# Patient Record
Sex: Female | Born: 1960 | Race: White | Hispanic: No | Smoking: Former smoker
Health system: Southern US, Community
[De-identification: ages and names within clinical notes are randomized; demographics above are authoritative.]

## PROBLEM LIST (undated history)

## (undated) ENCOUNTER — Emergency Department: Disposition: A | Discharge status: Home / Self Care | Payer: Managed Care, Other (non HMO)

## (undated) DIAGNOSIS — R519 Headache, unspecified: Secondary | ICD-10-CM

## (undated) DIAGNOSIS — K602 Anal fissure, unspecified: Secondary | ICD-10-CM

## (undated) DIAGNOSIS — E7800 Pure hypercholesterolemia, unspecified: Secondary | ICD-10-CM

## (undated) DIAGNOSIS — N840 Polyp of corpus uteri: Secondary | ICD-10-CM

## (undated) DIAGNOSIS — R51 Headache: Secondary | ICD-10-CM

## (undated) DIAGNOSIS — G43909 Migraine, unspecified, not intractable, without status migrainosus: Secondary | ICD-10-CM

## (undated) DIAGNOSIS — G479 Sleep disorder, unspecified: Secondary | ICD-10-CM

## (undated) HISTORY — DX: Polyp of corpus uteri: N84.0

## (undated) HISTORY — DX: Sleep disorder, unspecified: G47.9

## (undated) HISTORY — DX: Headache, unspecified: R51.9

## (undated) HISTORY — PX: WRIST SURGERY: SHX841

## (undated) HISTORY — DX: Headache: R51

## (undated) HISTORY — DX: Pure hypercholesterolemia, unspecified: E78.00

## (undated) HISTORY — DX: Anal fissure, unspecified: K60.2

---

## 1999-07-31 ENCOUNTER — Encounter: Payer: Self-pay | Admitting: Obstetrics and Gynecology

## 1999-07-31 ENCOUNTER — Ambulatory Visit (HOSPITAL_COMMUNITY): Admission: RE | Admit: 1999-07-31 | Discharge: 1999-07-31 | Payer: Self-pay | Admitting: Obstetrics and Gynecology

## 1999-08-05 ENCOUNTER — Other Ambulatory Visit: Admission: RE | Admit: 1999-08-05 | Discharge: 1999-08-05 | Payer: Self-pay | Admitting: Obstetrics and Gynecology

## 2000-07-23 ENCOUNTER — Encounter: Admission: RE | Admit: 2000-07-23 | Discharge: 2000-07-23 | Payer: Self-pay | Admitting: Obstetrics and Gynecology

## 2000-07-23 ENCOUNTER — Encounter: Payer: Self-pay | Admitting: Obstetrics and Gynecology

## 2000-11-27 ENCOUNTER — Other Ambulatory Visit: Admission: RE | Admit: 2000-11-27 | Discharge: 2000-11-27 | Payer: Self-pay | Admitting: Obstetrics and Gynecology

## 2001-07-20 ENCOUNTER — Ambulatory Visit (HOSPITAL_COMMUNITY): Admission: RE | Admit: 2001-07-20 | Discharge: 2001-07-20 | Payer: Self-pay | Admitting: Obstetrics and Gynecology

## 2001-07-20 ENCOUNTER — Encounter: Payer: Self-pay | Admitting: Obstetrics and Gynecology

## 2001-12-01 ENCOUNTER — Other Ambulatory Visit: Admission: RE | Admit: 2001-12-01 | Discharge: 2001-12-01 | Payer: Self-pay | Admitting: Obstetrics and Gynecology

## 2002-07-20 ENCOUNTER — Ambulatory Visit (HOSPITAL_COMMUNITY): Admission: RE | Admit: 2002-07-20 | Discharge: 2002-07-20 | Payer: Self-pay | Admitting: Obstetrics and Gynecology

## 2002-07-20 ENCOUNTER — Encounter: Payer: Self-pay | Admitting: Obstetrics and Gynecology

## 2003-08-01 ENCOUNTER — Encounter: Payer: Self-pay | Admitting: Obstetrics and Gynecology

## 2003-08-01 ENCOUNTER — Encounter: Admission: RE | Admit: 2003-08-01 | Discharge: 2003-08-01 | Payer: Self-pay | Admitting: Obstetrics and Gynecology

## 2003-08-04 ENCOUNTER — Other Ambulatory Visit: Admission: RE | Admit: 2003-08-04 | Discharge: 2003-08-04 | Payer: Self-pay | Admitting: Obstetrics and Gynecology

## 2004-07-24 ENCOUNTER — Encounter: Admission: RE | Admit: 2004-07-24 | Discharge: 2004-07-24 | Payer: Self-pay | Admitting: Obstetrics and Gynecology

## 2004-08-21 ENCOUNTER — Other Ambulatory Visit: Admission: RE | Admit: 2004-08-21 | Discharge: 2004-08-21 | Payer: Self-pay | Admitting: Obstetrics and Gynecology

## 2004-09-25 ENCOUNTER — Encounter: Admission: RE | Admit: 2004-09-25 | Discharge: 2004-09-25 | Payer: Self-pay | Admitting: Gastroenterology

## 2004-11-18 ENCOUNTER — Ambulatory Visit (HOSPITAL_COMMUNITY): Admission: RE | Admit: 2004-11-18 | Discharge: 2004-11-18 | Payer: Self-pay | Admitting: Gastroenterology

## 2004-12-15 DIAGNOSIS — K602 Anal fissure, unspecified: Secondary | ICD-10-CM

## 2004-12-15 HISTORY — DX: Anal fissure, unspecified: K60.2

## 2005-08-05 ENCOUNTER — Ambulatory Visit (HOSPITAL_COMMUNITY): Admission: RE | Admit: 2005-08-05 | Discharge: 2005-08-05 | Payer: Self-pay | Admitting: Obstetrics and Gynecology

## 2005-08-25 ENCOUNTER — Other Ambulatory Visit: Admission: RE | Admit: 2005-08-25 | Discharge: 2005-08-25 | Payer: Self-pay | Admitting: Obstetrics and Gynecology

## 2006-07-29 ENCOUNTER — Ambulatory Visit (HOSPITAL_COMMUNITY): Admission: RE | Admit: 2006-07-29 | Discharge: 2006-07-29 | Payer: Self-pay | Admitting: Obstetrics and Gynecology

## 2006-08-04 ENCOUNTER — Encounter: Admission: RE | Admit: 2006-08-04 | Discharge: 2006-08-04 | Payer: Self-pay | Admitting: Obstetrics and Gynecology

## 2006-09-03 ENCOUNTER — Other Ambulatory Visit: Admission: RE | Admit: 2006-09-03 | Discharge: 2006-09-03 | Payer: Self-pay | Admitting: Obstetrics and Gynecology

## 2007-08-02 ENCOUNTER — Encounter: Admission: RE | Admit: 2007-08-02 | Discharge: 2007-08-02 | Payer: Self-pay | Admitting: Obstetrics and Gynecology

## 2007-09-09 ENCOUNTER — Other Ambulatory Visit: Admission: RE | Admit: 2007-09-09 | Discharge: 2007-09-09 | Payer: Self-pay | Admitting: Obstetrics and Gynecology

## 2007-12-16 HISTORY — PX: DILATION AND CURETTAGE OF UTERUS: SHX78

## 2008-04-27 HISTORY — PX: DOPPLER ECHOCARDIOGRAPHY: SHX263

## 2008-08-02 ENCOUNTER — Encounter: Admission: RE | Admit: 2008-08-02 | Discharge: 2008-08-02 | Payer: Self-pay | Admitting: Obstetrics and Gynecology

## 2008-09-13 ENCOUNTER — Ambulatory Visit: Payer: Self-pay | Admitting: Obstetrics and Gynecology

## 2008-09-13 ENCOUNTER — Other Ambulatory Visit: Admission: RE | Admit: 2008-09-13 | Discharge: 2008-09-13 | Payer: Self-pay | Admitting: Obstetrics and Gynecology

## 2008-09-13 ENCOUNTER — Encounter: Payer: Self-pay | Admitting: Obstetrics and Gynecology

## 2008-09-28 ENCOUNTER — Ambulatory Visit: Payer: Self-pay | Admitting: Obstetrics and Gynecology

## 2008-10-13 ENCOUNTER — Ambulatory Visit: Payer: Self-pay | Admitting: Obstetrics and Gynecology

## 2008-11-14 ENCOUNTER — Ambulatory Visit: Payer: Self-pay | Admitting: Obstetrics and Gynecology

## 2008-11-21 ENCOUNTER — Ambulatory Visit (HOSPITAL_BASED_OUTPATIENT_CLINIC_OR_DEPARTMENT_OTHER): Admission: RE | Admit: 2008-11-21 | Discharge: 2008-11-21 | Payer: Self-pay | Admitting: Obstetrics and Gynecology

## 2008-11-21 ENCOUNTER — Encounter: Payer: Self-pay | Admitting: Obstetrics and Gynecology

## 2008-11-21 ENCOUNTER — Ambulatory Visit: Payer: Self-pay | Admitting: Obstetrics and Gynecology

## 2008-12-05 ENCOUNTER — Ambulatory Visit: Payer: Self-pay | Admitting: Obstetrics and Gynecology

## 2009-07-19 ENCOUNTER — Ambulatory Visit: Payer: Self-pay | Admitting: Obstetrics and Gynecology

## 2009-08-16 ENCOUNTER — Encounter: Admission: RE | Admit: 2009-08-16 | Discharge: 2009-08-16 | Payer: Self-pay | Admitting: Obstetrics and Gynecology

## 2009-09-21 ENCOUNTER — Ambulatory Visit: Payer: Self-pay | Admitting: Obstetrics and Gynecology

## 2009-09-21 ENCOUNTER — Other Ambulatory Visit: Admission: RE | Admit: 2009-09-21 | Discharge: 2009-09-21 | Payer: Self-pay | Admitting: Obstetrics and Gynecology

## 2009-09-21 ENCOUNTER — Encounter: Payer: Self-pay | Admitting: Obstetrics and Gynecology

## 2010-06-21 ENCOUNTER — Ambulatory Visit: Payer: Self-pay | Admitting: Obstetrics and Gynecology

## 2011-01-05 ENCOUNTER — Encounter: Payer: Self-pay | Admitting: Obstetrics and Gynecology

## 2011-01-13 ENCOUNTER — Other Ambulatory Visit: Payer: Self-pay | Admitting: Obstetrics and Gynecology

## 2011-01-13 DIAGNOSIS — Z0000 Encounter for general adult medical examination without abnormal findings: Secondary | ICD-10-CM

## 2011-01-15 ENCOUNTER — Other Ambulatory Visit: Payer: Self-pay | Admitting: Obstetrics and Gynecology

## 2011-01-15 ENCOUNTER — Ambulatory Visit
Admission: RE | Admit: 2011-01-15 | Discharge: 2011-01-15 | Disposition: A | Discharge status: Home / Self Care | Payer: Self-pay | Source: Ambulatory Visit | Attending: Obstetrics and Gynecology | Admitting: Obstetrics and Gynecology

## 2011-01-15 ENCOUNTER — Ambulatory Visit: Admission: RE | Admit: 2011-01-15 | Payer: Self-pay | Source: Ambulatory Visit

## 2011-01-15 DIAGNOSIS — Z0000 Encounter for general adult medical examination without abnormal findings: Secondary | ICD-10-CM

## 2011-03-07 ENCOUNTER — Inpatient Hospital Stay: Admission: RE | Admit: 2011-03-07 | Payer: Self-pay | Source: Ambulatory Visit

## 2011-04-29 NOTE — Op Note (Signed)
NAMELOLAMAE, VOISIN                  ACCOUNT NO.:  [id number]   MEDICAL RECORD NO.:  [id number]          PATIENT TYPE:  AMB   LOCATION:  [facility]                         FACILITY:  [facility]   PHYSICIAN:  Daniel L. Gottsegen, M.D.DATE OF BIRTH:  11/29/1961   DATE OF PROCEDURE:  11/21/2008  DATE OF DISCHARGE:                               OPERATIVE REPORT   PREOPERATIVE DIAGNOSIS:  Menorrhagia with an endometrial polyp.   POSTOPERATIVE DIAGNOSIS:  Menorrhagia with an endometrial polyp.   OPERATIONS:  Hysteroscopy; D and C.   SURGEON:  Dr. Eda Paschal   ANESTHESIA:  General.   INDICATIONS:  The patient is a 50 year old gravida 2, para 2, AB 0 who  had presented to the office with a prolonged period that was extremely  heavy.  This was unusual for her.  She underwent ultrasound followed by  saline infusion histogram and had a 2 cm polyp that was found.  She was  initiated on Megace to stop the bleeding and she enters the hospital now  for hysteroscopy D and C and excision of polyp.   FINDINGS:  External is normal.  BUS is normal.  Vaginal is normal.  Cervix is clean.  The uterus appears slightly enlarged and boggy,  although no myomas had been seen on ultrasound.  Although the appearance  of her uterus was consistent with adenomyosis which might explain the  boggy uterus.  Adnexa failed to reveal masses.  At the time of  hysteroscopy, the patient had an endometrial polyp coming off the  posterior fundal wall.  Once it was removed, top of the fundus, tubal  ostia, anterior posterior walls of the fundus, lower uterine segment,  endocervical canal were free of disease.   DESCRIPTION OF PROCEDURE:  After adequate general anesthesia, the  patient was placed in dorsal supine position, prepped and draped in  usual sterile manner.  A single-tooth tenaculum was placed in the  anterior lip of the cervix.  The patient had just slight uterine  descensus.  It was difficult to dilate her cervix, but it  finally was  dilated to #[facility] dilator.  A hysteroscopic examination was done  using the hysteroscopic resectoscope, 3% sorbitol expanded the  intrauterine cavity.  A camera for magnification and 90 degree wire loop  with appropriate settings.  The above findings were found.  The  endometrial polyp was excised with the wire loop, sharp curetting was  also done with removal of more tissue and endometrium.  The patient was  re-hysteroscoped and she had no further intrauterine pathology.  The  procedure was terminated.  Blood loss was minimal.  Fluid deficit was  minimal.  Endometrial curettings and polyp were sent to pathology for  tissue diagnosis.  The patient left the operating in satisfactory  condition.     Daniel L. Eda Paschal, M.D.  Electronically Signed    DLG/MEDQ  D:  11/21/2008  T:  11/21/2008  Job:  [id number]

## 2011-05-02 NOTE — Op Note (Signed)
Julie Lucero, WARNING                  ACCOUNT NO.:  [id number]   MEDICAL RECORD NO.:  [id number]          PATIENT TYPE:  AMB   LOCATION:  ENDO                         FACILITY:  [facility]   PHYSICIAN:  Anselmo Rod, M.D.  DATE OF BIRTH:  07/09/1961   DATE OF PROCEDURE:  11/18/2004  DATE OF DISCHARGE:                                 OPERATIVE REPORT   PROCEDURE PERFORMED:  Screening colonoscopy.   ENDOSCOPIST:  Charna Elizabeth, M.D.   INSTRUMENT USED:  Olympus video colonoscope.   INDICATIONS FOR PROCEDURE:  The patient is a 50 year old white female with a  history of severe  left lower quadrant pain, chronic constipation with  recent change in bowel habits and family history of colon cancer, undergoing  a screening colonoscopy to rule out colonic polyps, masses, etc.   PREPROCEDURE PREPARATION:  Informed consent was procured from the patient.  The patient was fasted for eight hours prior to the procedure and prepped  with a bottle of magnesium citrate and a gallon of GoLYTELY the night prior  to the procedure.   PREPROCEDURE PHYSICAL:  The patient had stable vital signs.  Neck supple.  Chest clear to auscultation.  S1 and S2 regular.  Abdomen soft with normal  bowel sounds.   DESCRIPTION OF PROCEDURE:  The patient was placed in left lateral decubitus  position and sedated with 100 mg of Demerol and 10 mg of Versed in slow  incremental doses.  Once the patient was adequately sedated and maintained  on low flow oxygen and continuous cardiac monitoring, the Olympus video  colonoscope was advanced from the rectum to the cecum without difficulty.  The patient had a significant amount of residual stool in the colon as she  could not complete her prep.  Multiple washes were done.  Stool was  suctioned all the way from the rectum to the cecum.  The appendicular  orifice and ileocecal valve were clearly visualized and photographed.  The  patient's position was changed from the left lateral to  the supine position  with gentle application of abdominal pressure to reach the cecum.  The  terminal ileum appeared healthy without lesions.  No masses, polyps,  erosions, ulcerations or diverticula were seen.  Small internal hemorrhoids  were appreciated on retroflexion in the rectum.  The patient tolerated the  procedure well without immediate complication.   IMPRESSION:  1.  Normal colonoscopy up to the terminal ileum except for small internal      hemorrhoids.  2.  No masses, polyps or diverticula seen.   RECOMMENDATIONS:  1.  Continue high fiber diet with liberal fluid intake.  2.  Repeat colonoscopy in the next five years unless the patient develops      any abnormal symptoms in the interim.  3.  Outpatient followup as need arises in the future.  4.  Trial of Zelnorm or stool softeners is recommended along with the high      fiber diet and liberal fluid intake.      Jyot   JNM/MEDQ  D:  11/18/2004  T:  11/18/2004  Job:  [id number]   cc:   Rande Brunt. Eda Paschal, M.D.  [address], [address]  [place]  [place] [address]  Fax: [phone]   L. Lupe Carney, M.D.  [address] [place]  [place] [address]  Fax: [phone]

## 2011-05-09 ENCOUNTER — Ambulatory Visit
Admission: RE | Admit: 2011-05-09 | Discharge: 2011-05-09 | Disposition: A | Discharge status: Home / Self Care | Payer: No Typology Code available for payment source | Source: Ambulatory Visit | Attending: Obstetrics and Gynecology | Admitting: Obstetrics and Gynecology

## 2011-05-09 DIAGNOSIS — Z0000 Encounter for general adult medical examination without abnormal findings: Secondary | ICD-10-CM

## 2011-05-15 ENCOUNTER — Other Ambulatory Visit: Payer: Self-pay | Admitting: Obstetrics and Gynecology

## 2011-05-15 ENCOUNTER — Encounter (INDEPENDENT_AMBULATORY_CARE_PROVIDER_SITE_OTHER): Payer: No Typology Code available for payment source | Admitting: Obstetrics and Gynecology

## 2011-05-15 ENCOUNTER — Other Ambulatory Visit (HOSPITAL_COMMUNITY)
Admission: RE | Admit: 2011-05-15 | Discharge: 2011-05-15 | Disposition: A | Discharge status: Home / Self Care | Payer: No Typology Code available for payment source | Source: Ambulatory Visit | Attending: Obstetrics and Gynecology | Admitting: Obstetrics and Gynecology

## 2011-05-15 DIAGNOSIS — Z124 Encounter for screening for malignant neoplasm of cervix: Secondary | ICD-10-CM | POA: Insufficient documentation

## 2011-05-15 DIAGNOSIS — Z01419 Encounter for gynecological examination (general) (routine) without abnormal findings: Secondary | ICD-10-CM

## 2011-08-29 ENCOUNTER — Other Ambulatory Visit: Payer: Self-pay | Admitting: Obstetrics and Gynecology

## 2011-08-29 DIAGNOSIS — R921 Mammographic calcification found on diagnostic imaging of breast: Secondary | ICD-10-CM

## 2012-08-12 ENCOUNTER — Encounter: Payer: Self-pay | Admitting: Obstetrics and Gynecology

## 2012-08-12 ENCOUNTER — Ambulatory Visit (INDEPENDENT_AMBULATORY_CARE_PROVIDER_SITE_OTHER): Payer: No Typology Code available for payment source | Admitting: Obstetrics and Gynecology

## 2012-08-12 VITALS — BP 130/80 | Ht 63.0 in | Wt 145.0 lb

## 2012-08-12 DIAGNOSIS — Z01419 Encounter for gynecological examination (general) (routine) without abnormal findings: Secondary | ICD-10-CM

## 2012-08-12 LAB — URINALYSIS W MICROSCOPIC + REFLEX CULTURE
Bilirubin Urine: NEGATIVE
Casts: NONE SEEN
Glucose, UA: NEGATIVE mg/dL
Ketones, ur: NEGATIVE mg/dL
Nitrite: NEGATIVE
RBC / HPF: NONE SEEN RBC/hpf (ref <3)
Urobilinogen, UA: 0.2 mg/dL (ref 0.0–1.0)

## 2012-08-12 NOTE — Progress Notes (Signed)
Patient came to see me today for her annual GYN exam. Her hot flashes have gotten much better. She never took HRT. She is due for a mammogram. She has never had a bone density. She has been menopausal for approximately 2 years. She has never had an abnormal Pap smear. Her last Pap smear was 2012. She does her lab through her cardiologist. Her cholesterol is doing well on medication. 48 hours ago she had gross hematuria x1 but none since. She has some very vague urinary symptoms but not strong enough to just treat. Her urinalysis today did not show microscopic hematuria but she did have 3-6 white blood cells per high-power field. She is sure this was not vaginal bleeding. She is having no pelvic pain.  Physical examination:Kim Julian Reil present. HEENT within normal limits. Neck: Thyroid not large. No masses. Supraclavicular nodes: not enlarged. Breasts: Examined in both sitting and lying  position. No skin changes and no masses. Abdomen: Soft no guarding rebound or masses or hernia. Pelvic: External: Within normal limits. BUS: Within normal limits. Vaginal:within normal limits. Good estrogen effect. No evidence of cystocele rectocele or enterocele. Cervix: clean. Uterus: Normal size and shape. Adnexa: No masses. Rectovaginal exam: Confirmatory and negative. Extremities: Within normal limits.  Assessment: #1. Normal GYN exam #2. Mild menopausal symptoms #3. One episode of hematuria 48 hours ago. #4. Possible urinary tract infection  Plan: Mammogram and  bone density. Urine culture. Obviously we will treat a urinary tract infection. If all negative I have told her we should repeat the urinalysis in several weeks. She needs to report to me any more hematuria.The new Pap smear guidelines were discussed with the patient. No pap done.

## 2012-08-12 NOTE — Patient Instructions (Signed)
Schedule mammogram and  bone density. Report any hematuria to me.

## 2012-08-17 ENCOUNTER — Other Ambulatory Visit: Payer: Self-pay | Admitting: Obstetrics and Gynecology

## 2012-08-17 DIAGNOSIS — R3129 Other microscopic hematuria: Secondary | ICD-10-CM

## 2012-09-30 ENCOUNTER — Other Ambulatory Visit: Payer: Self-pay | Admitting: Obstetrics and Gynecology

## 2012-09-30 ENCOUNTER — Ambulatory Visit
Admission: RE | Admit: 2012-09-30 | Discharge: 2012-09-30 | Disposition: A | Discharge status: Home / Self Care | Payer: No Typology Code available for payment source | Source: Ambulatory Visit | Attending: Obstetrics and Gynecology | Admitting: Obstetrics and Gynecology

## 2012-09-30 DIAGNOSIS — Z139 Encounter for screening, unspecified: Secondary | ICD-10-CM

## 2013-06-14 ENCOUNTER — Other Ambulatory Visit: Payer: Self-pay | Admitting: Neurology

## 2013-07-14 ENCOUNTER — Telehealth: Payer: Self-pay | Admitting: *Deleted

## 2013-07-14 DIAGNOSIS — R5381 Other malaise: Secondary | ICD-10-CM

## 2013-07-14 DIAGNOSIS — Z79899 Other long term (current) drug therapy: Secondary | ICD-10-CM

## 2013-07-14 DIAGNOSIS — E782 Mixed hyperlipidemia: Secondary | ICD-10-CM

## 2013-07-14 NOTE — Telephone Encounter (Signed)
Lab request mailed to patient

## 2013-08-01 ENCOUNTER — Telehealth: Payer: Self-pay | Admitting: Nurse Practitioner

## 2013-08-02 MED ORDER — TOPIRAMATE 50 MG PO TABS: 50.0000 mg | ORAL_TABLET | Freq: Two times a day (BID) | ORAL | Status: DC

## 2013-08-02 NOTE — Telephone Encounter (Signed)
I have been out of the office since 08/12.  Rx was sent to last until appt.  I tried to call patient, phone rang several times with no answer, no VM picked up.

## 2013-08-11 ENCOUNTER — Encounter: Payer: Self-pay | Admitting: *Deleted

## 2013-08-16 ENCOUNTER — Other Ambulatory Visit: Payer: Self-pay | Admitting: Neurology

## 2013-08-16 ENCOUNTER — Encounter: Payer: Self-pay | Admitting: Cardiovascular Disease

## 2013-08-17 ENCOUNTER — Encounter: Payer: Self-pay | Admitting: Cardiovascular Disease

## 2013-08-17 ENCOUNTER — Ambulatory Visit (INDEPENDENT_AMBULATORY_CARE_PROVIDER_SITE_OTHER): Payer: No Typology Code available for payment source | Admitting: Cardiovascular Disease

## 2013-08-17 VITALS — BP 142/84 | HR 75 | Resp 16 | Ht 63.25 in | Wt 154.8 lb

## 2013-08-17 DIAGNOSIS — E7800 Pure hypercholesterolemia, unspecified: Secondary | ICD-10-CM

## 2013-08-17 DIAGNOSIS — R002 Palpitations: Secondary | ICD-10-CM

## 2013-08-17 LAB — LIPID PANEL
HDL: 83 mg/dL (ref >39)
LDL Cholesterol: 83 mg/dL (ref 0–99)
Triglycerides: 80 mg/dL (ref <150)
VLDL: 16 mg/dL (ref 0–40)

## 2013-08-17 LAB — CBC
HCT: 43.2 % (ref 36.0–46.0)
Hemoglobin: 14.7 g/dL (ref 12.0–15.0)
Platelets: 235 10*3/uL (ref 150–400)
RBC: 4.9 MIL/uL (ref 3.87–5.11)
RDW: 14.2 % (ref 11.5–15.5)

## 2013-08-17 LAB — COMPREHENSIVE METABOLIC PANEL
AST: 18 U/L (ref 0–37)
Albumin: 4.7 g/dL (ref 3.5–5.2)
Alkaline Phosphatase: 59 U/L (ref 39–117)
BUN: 19 mg/dL (ref 6–23)
Calcium: 9.9 mg/dL (ref 8.4–10.5)
Creat: 0.74 mg/dL (ref 0.50–1.10)
Glucose, Bld: 93 mg/dL (ref 70–99)
Potassium: 4.3 mEq/L (ref 3.5–5.3)

## 2013-08-17 NOTE — Assessment & Plan Note (Signed)
These are infrequent and not particular symptomatic. They never occur during activity. No specific therapy is necessary.

## 2013-08-17 NOTE — Assessment & Plan Note (Signed)
She had her labs drawn this morning. The results are not yet available. She has a strong family history of premature CAD and there is an argument to  Be made to keep her LDL cholesterol 100 g/dL or less.

## 2013-08-17 NOTE — Patient Instructions (Addendum)
Your physician recommends that you schedule a follow-up appointment in: 12 months.  

## 2013-08-17 NOTE — Progress Notes (Signed)
Patient ID: Julie Lucero, female   DOB: 1961/03/20, 52 y.o.   MRN: [id number]     Reason for office visit Hypercholesterolemia, family history CAD, palpitations  Julie Lucero is feeling great. She continues to exercise 3 days weekly with very intense circuit training and " boot camp". She feels great following exercise and never has palpitations during physical activity. She has occasional isolated skipped beats that are not particularly bothersome. She had lab tests done today that are not yet available for review. Gastric her blood pressure repeatedly and although the reading is borderline today, typically her blood pressure is in the 120s over 70s.    No Known Allergies  Current Outpatient Prescriptions  Medication Sig Dispense Refill  . polyethylene glycol (MIRALAX / GLYCOLAX) packet Take 17 g by mouth daily.        . rizatriptan (MAXALT) 10 MG tablet Take 1 tablet (10 mg total) by mouth as needed for migraine.  9 tablet  1  . simvastatin (ZOCOR) 10 MG tablet Take 20 mg by mouth at bedtime.       . topiramate (TOPAMAX) 50 MG tablet Take 1 tablet (50 mg total) by mouth 2 (two) times daily.  60 tablet  0   No current facility-administered medications for this visit.    Past Medical History  Diagnosis Date  . H/O: vasectomy     PATIENTS PARTNER WITH VASECTOMY  . Endometrial polyp   . Anal fissure 2006  . Elevated cholesterol     Past Surgical History  Procedure Laterality Date  . Dilation and curettage of uterus  2009  . Wrist surgery    . Doppler echocardiography  04/27/2008    EF =>55%;lv fx normal    Family History  Problem Relation Age of Onset  . Diabetes Mother   . Cancer Mother     PANCREATIC  . Hypertension Father   . Heart disease Father   . Hypertension Sister   . Diabetes Paternal Grandmother   . Diabetes Paternal Grandfather   . Hypertension Paternal Grandfather   . Heart disease Paternal Grandfather     History   Social History  . Marital Status: Married      Spouse Name: N/A    Number of Children: N/A  . Years of Education: N/A   Occupational History  . Not on file.   Social History Main Topics  . Smoking status: Former Games developer  . Smokeless tobacco: Not on file  . Alcohol Use: Yes     Comment: Rare  . Drug Use: Not on file  . Sexual Activity: Yes    Birth Control/ Protection: Post-menopausal     Comment: Vasectomy   Other Topics Concern  . Not on file   Social History Narrative  . No narrative on file    Review of systems: The patient specifically denies any chest pain at rest or with exertion, dyspnea at rest or with exertion, orthopnea, paroxysmal nocturnal dyspnea, syncope,  focal neurological deficits, intermittent claudication, lower extremity edema, unexplained weight gain, cough, hemoptysis or wheezing.  The patient also denies abdominal pain, nausea, vomiting, dysphagia, diarrhea, constipation, polyuria, polydipsia, dysuria, hematuria, frequency, urgency, abnormal bleeding or bruising, fever, chills, unexpected weight changes, mood swings, change in skin or hair texture, change in voice quality, auditory or visual problems, allergic reactions or rashes, new musculoskeletal complaints other than usual "aches and pains".   PHYSICAL EXAM BP 142/84  Pulse 75  Resp 16  Ht 5' 3.25" (1.607 m)  Wt 154  lb 12.8 oz (70.217 kg)  BMI 27.19 kg/m2  General: Alert, oriented x3, no distress Head: no evidence of trauma, PERRL, EOMI, no exophtalmos or lid lag, no myxedema, no xanthelasma; normal ears, nose and oropharynx Neck: normal jugular venous pulsations and no hepatojugular reflux; brisk carotid pulses without delay and no carotid bruits Chest: clear to auscultation, no signs of consolidation by percussion or palpation, normal fremitus, symmetrical and full respiratory excursions Cardiovascular: normal position and quality of the apical impulse, regular rhythm, normal first and second heart sounds, no murmurs, rubs or  gallops Abdomen: no tenderness or distention, no masses by palpation, no abnormal pulsatility or arterial bruits, normal bowel sounds, no hepatosplenomegaly Extremities: no clubbing, cyanosis or edema; 2+ radial, ulnar and brachial pulses bilaterally; 2+ right femoral, posterior tibial and dorsalis pedis pulses; 2+ left femoral, posterior tibial and dorsalis pedis pulses; no subclavian or femoral bruits Neurological: grossly nonfocal   EKG: Normal sinus rhythm, no repolarization abnormalities  August 2013 total cholesterol 177, triglycerides 52, HDL 66, LDL 101, normal liver function tests, electrolytes, thyroid function studies, CBC, creatinine 0.8  ASSESSMENT AND PLAN Hypercholesteremia She had her labs drawn this morning. The results are not yet available. She has a strong family history of premature CAD and there is an argument to  Be made to keep her LDL cholesterol 100 g/dL or less.  Palpitations These are infrequent and not particular symptomatic. They never occur during activity. No specific therapy is necessary.  Orders Placed This Encounter  Procedures  . EKG 12-Lead   Legacie Dillingham  Thurmon Fair, MD, [facility] [phone] office [phone] pager

## 2013-08-18 ENCOUNTER — Encounter: Payer: Self-pay | Admitting: Gynecology

## 2013-08-19 ENCOUNTER — Ambulatory Visit (INDEPENDENT_AMBULATORY_CARE_PROVIDER_SITE_OTHER): Payer: PRIVATE HEALTH INSURANCE | Admitting: Gynecology

## 2013-08-19 ENCOUNTER — Encounter: Payer: Self-pay | Admitting: Gynecology

## 2013-08-19 VITALS — BP 120/76 | Ht 64.0 in | Wt 156.0 lb

## 2013-08-19 DIAGNOSIS — Z01419 Encounter for gynecological examination (general) (routine) without abnormal findings: Secondary | ICD-10-CM

## 2013-08-19 DIAGNOSIS — N393 Stress incontinence (female) (male): Secondary | ICD-10-CM

## 2013-08-19 DIAGNOSIS — N951 Menopausal and female climacteric states: Secondary | ICD-10-CM

## 2013-08-19 NOTE — Patient Instructions (Addendum)
Followup in one year for annual exam. Call if any vaginal bleeding. Call if your urinary symptoms worsen and we will arrange an appointment to see a urologist.

## 2013-08-19 NOTE — Progress Notes (Signed)
Julie Lucero 1961/09/26 [id number]        52 y.o.  G2P2002 for annual exam.  Former patient of Dr. Eda Paschal. Several issues noted below.  Past medical history,surgical history, medications, allergies, family history and social history were all reviewed and documented in the EPIC chart.  ROS:  Performed and pertinent positives and negatives are included in the history, assessment and plan .  Exam: Kim assistant Filed Vitals:   08/19/13 0826  BP: 120/76  Height: 5\' 4"  (1.626 m)  Weight: 156 lb (70.761 kg)   General appearance  Normal Skin grossly normal Head/Neck normal with no cervical or supraclavicular adenopathy thyroid normal Lungs  clear Cardiac RR, without RMG Abdominal  soft, nontender, without masses, organomegaly or hernia Breasts  examined lying and sitting without masses, retractions, discharge or axillary adenopathy. Pelvic  Ext/BUS/vagina  normal  Cervix  normal  Uterus  axial to retroverted, normal size, shape and contour, midline and mobile nontender   Adnexa  Without masses or tenderness    Anus and perineum  normal   Rectovaginal  normal sphincter tone without palpated masses or tenderness.    Assessment/Plan:  52 y.o. G92P2002 female for annual exam.   1. Postmenopausal. Having some hot flashes but overall tolerable. No vaginal symptoms. Recommended trial of OTC soy based products to see if this does not help. She does not feel this is significant enough to warrant HRT. Patient knows to report any vaginal bleeding. 2. Stress urinary incontinence symptoms. Loss of urine with laughing sneezing coughing. Discussed options to include Kegel exercises, behavior modification such as more frequent voiding and avoiding bladder irritants foods and beverages and surgery. If patient desires surgical assessment now referred to urology. 3. Pap smear 2012. No Pap smear done today. No history of abnormal Pap smears previously. Plan repeat Pap smear next year at 3 year  interval. 4. Mammography October 2013. Repeat this coming October. SBE monthly reviewed. 5. Colonoscopy 1997. Recommend she check with gastroenterologist to see when they want to repeat this. Patient agrees to do so. 6. Health maintenance. Recently had blood work through her cardiologist who is following her for her cholesterol. Followup one year, sooner as needed.  Note: This document was prepared with digital dictation and possible smart phrase technology. Any transcriptional errors that result from this process are unintentional.   Dara Lords MD, 8:49 AM 08/19/2013

## 2013-08-20 LAB — URINALYSIS W MICROSCOPIC + REFLEX CULTURE
Bacteria, UA: NONE SEEN
Bilirubin Urine: NEGATIVE
Hgb urine dipstick: NEGATIVE
Ketones, ur: NEGATIVE mg/dL
Protein, ur: NEGATIVE mg/dL
Squamous Epithelial / LPF: NONE SEEN
Urobilinogen, UA: 0.2 mg/dL (ref 0.0–1.0)

## 2013-08-23 ENCOUNTER — Encounter: Payer: Self-pay | Admitting: Obstetrics and Gynecology

## 2013-08-24 ENCOUNTER — Encounter: Payer: Self-pay | Admitting: *Deleted

## 2013-09-23 ENCOUNTER — Ambulatory Visit (INDEPENDENT_AMBULATORY_CARE_PROVIDER_SITE_OTHER): Payer: PRIVATE HEALTH INSURANCE | Admitting: Nurse Practitioner

## 2013-09-23 ENCOUNTER — Encounter: Payer: Self-pay | Admitting: Nurse Practitioner

## 2013-09-23 ENCOUNTER — Encounter (INDEPENDENT_AMBULATORY_CARE_PROVIDER_SITE_OTHER): Payer: Self-pay

## 2013-09-23 VITALS — BP 109/79 | HR 102 | Ht 64.0 in | Wt 156.0 lb

## 2013-09-23 DIAGNOSIS — G43829 Menstrual migraine, not intractable, without status migrainosus: Secondary | ICD-10-CM

## 2013-09-23 MED ORDER — TOPIRAMATE 50 MG PO TABS: 50.0000 mg | ORAL_TABLET | Freq: Two times a day (BID) | ORAL | Status: DC

## 2013-09-23 MED ORDER — RIZATRIPTAN BENZOATE 10 MG PO TABS: 10.0000 mg | ORAL_TABLET | ORAL | Status: DC | PRN

## 2013-09-23 NOTE — Patient Instructions (Signed)
Continue Topamax at current dose Continue maxalt acutely Will refill meds F/U yearly

## 2013-09-23 NOTE — Progress Notes (Signed)
[facility]  PATIENT: Julie Lucero DOB: 1961/05/31   REASON FOR VISIT: Followup for migraine   HISTORY OF PRESENT ILLNESS:Ms Julie Lucero, 52 yr old returns for follow up. Headache frequency about  1-2/month. Topamax working well. Did not get response from [facility]. Maxalt is working and it is generic  Relpax too costly. She needs refills today. Recent labs reviewed     HISTORY: Referral  for migraine management per Dr. Eda Lucero, GYN.  Has migrainous HA at least once every ten days, and feels impaired from frequency.  Headaches wake her up from sleep.  Maxalt gives relief but  often headaches exceed the amount of Maxalt she can take within a day. She gets nausea, photophobia and phonophobia. Mostly manifesting  on left forehead and radiates to temple and occiput.  Her brother used to work for [employer] and she felt it worked very well until the past year. Teenage onset - "I always had headaches"  and her daughter has had these too, since teenage   REVIEW OF SYSTEMS: Full 14 system review of systems performed and notable only for:  Constitutional: N/A  Cardiovascular: N/A  Ear/Nose/Throat: N/A  Skin: N/A  Eyes: N/A  Respiratory: N/A  Gastroitestinal: N/A  Hematology/Lymphatic: N/A  Endocrine: N/A Musculoskeletal:N/A  Allergy/Immunology: N/A  Neurological: N/A Psychiatric: N/A   ALLERGIES: No Known Allergies  HOME MEDICATIONS: Outpatient Prescriptions Prior to Visit  Medication Sig Dispense Refill  . polyethylene glycol (MIRALAX / GLYCOLAX) packet Take 17 g by mouth daily.        . rizatriptan (MAXALT) 10 MG tablet Take 1 tablet (10 mg total) by mouth as needed for migraine.  9 tablet  1  . simvastatin (ZOCOR) 10 MG tablet Take 20 mg by mouth at bedtime.       . topiramate (TOPAMAX) 50 MG tablet Take 1 tablet (50 mg total) by mouth 2 (two) times daily.  60 tablet  0   No facility-administered medications prior to visit.    PAST MEDICAL  HISTORY: Past Medical History  Diagnosis Date  . H/O: vasectomy     PATIENTS PARTNER WITH VASECTOMY  . Endometrial polyp   . Anal fissure 2006  . Elevated cholesterol     PAST SURGICAL HISTORY: Past Surgical History  Procedure Laterality Date  . Dilation and curettage of uterus  2009  . Wrist surgery    . Doppler echocardiography  04/27/2008    EF =>55%;lv fx normal    FAMILY HISTORY: Family History  Problem Relation Age of Onset  . Diabetes Mother   . Cancer Mother     PANCREATIC  . Hypertension Father   . Heart disease Father   . Hypertension Sister   . Diabetes Paternal Grandmother   . Diabetes Paternal Grandfather   . Hypertension Paternal Grandfather   . Heart disease Paternal Grandfather   . Diabetes Brother     SOCIAL HISTORY: History   Social History  . Marital Status: Married    Spouse Name: N/A    Number of Children: 2  . Years of Education: HS grad   Occupational History  . Radiologic technician. [employer]   Social History Main Topics  . Smoking status: Former Games developer  . Smokeless tobacco: Never Used  . Alcohol Use: Yes     Comment: Rare  . Drug Use: No  . Sexual Activity: Yes    Birth Control/ Protection: Post-menopausal     Comment: Vasectomy   Other Topics Concern  .  Not on file   Social History Narrative  . No narrative on file     PHYSICAL EXAM  Filed Vitals:   09/23/13 1103  BP: 109/79  Pulse: 102  Height: 5\' 4"  (1.626 m)  Weight: 156 lb (70.761 kg)   Body mass index is 26.76 kg/(m^2).  Generalized: Well developed, in no acute distress   Neurological examination   Mentation: Alert oriented to time, place, history taking. Follows all commands speech and language fluent  Cranial nerve II-XII: Pupils were equal round reactive to light extraocular movements were full, visual field were full on confrontational test. Facial sensation and strength were normal. hearing was intact to finger rubbing bilaterally. Uvula  tongue midline. head turning and shoulder shrug and were normal and symmetric.Tongue protrusion into cheek strength was normal. Motor: normal bulk and tone, full strength in the BUE, BLE, fine finger movements normal, no pronator drift. No focal weakness Coordination: finger-nose-finger, heel-to-shin bilaterally, no dysmetria Reflexes: Brachioradialis 2/2, biceps 2/2, triceps 2/2, patellar 2/2, Achilles 2/2, plantar responses were flexor bilaterally. Gait and Station: Rising up from seated position without assistance, normal stance, moderate stride, good arm swing, smooth turning, able to perform tiptoe, and heel walking without difficulty. Tandem gait normal  DIAGNOSTIC DATA (LABS, IMAGING, TESTING) - I reviewed patient records, labs, notes, testing and imaging myself where available.  Lab Results  Component Value Date   WBC 6.4 08/17/2013   HGB 14.7 08/17/2013   HCT 43.2 08/17/2013   MCV 88.2 08/17/2013   PLT 235 08/17/2013      Component Value Date/Time   NA 143 08/17/2013 0837   K 4.3 08/17/2013 0837   CL 107 08/17/2013 0837   CO2 27 08/17/2013 0837   GLUCOSE 93 08/17/2013 0837   BUN 19 08/17/2013 0837   CREATININE 0.74 08/17/2013 0837   CALCIUM 9.9 08/17/2013 0837   PROT 6.6 08/17/2013 0837   ALBUMIN 4.7 08/17/2013 0837   AST 18 08/17/2013 0837   ALT 18 08/17/2013 0837   ALKPHOS 59 08/17/2013 0837   BILITOT 0.5 08/17/2013 0837   Lab Results  Component Value Date   CHOL 182 08/17/2013   HDL 83 08/17/2013   LDLCALC 83 08/17/2013   TRIG 80 08/17/2013   CHOLHDL 2.2 08/17/2013     Lab Results  Component Value Date   TSH 1.075 08/17/2013     ASSESSMENT AND PLAN  52 y.o. year old female  has a past medical history of H/O: migraines here for followup.   Continue Topamax at current dose Continue maxalt acutely Will refill meds F/U yearly   Julie Lucero, [facility], [facility], APRN  [facility] [address] [place], [place] [address] [phone]

## 2013-11-28 ENCOUNTER — Other Ambulatory Visit: Payer: Self-pay | Admitting: Dermatology

## 2013-12-29 ENCOUNTER — Ambulatory Visit: Payer: PRIVATE HEALTH INSURANCE | Admitting: Women's Health

## 2014-02-18 ENCOUNTER — Other Ambulatory Visit: Payer: Self-pay | Admitting: Cardiovascular Disease

## 2014-02-20 NOTE — Telephone Encounter (Signed)
Rx was sent to pharmacy electronically. 

## 2014-08-22 ENCOUNTER — Encounter: Payer: PRIVATE HEALTH INSURANCE | Admitting: Gynecology

## 2014-08-24 ENCOUNTER — Ambulatory Visit
Admission: RE | Admit: 2014-08-24 | Discharge: 2014-08-24 | Disposition: A | Discharge status: Home / Self Care | Payer: PRIVATE HEALTH INSURANCE | Source: Ambulatory Visit | Attending: Gynecology | Admitting: Gynecology

## 2014-08-24 ENCOUNTER — Other Ambulatory Visit: Payer: Self-pay | Admitting: Gynecology

## 2014-08-24 DIAGNOSIS — Z1231 Encounter for screening mammogram for malignant neoplasm of breast: Secondary | ICD-10-CM

## 2014-08-28 ENCOUNTER — Telehealth: Payer: Self-pay | Admitting: Cardiovascular Disease

## 2014-08-28 DIAGNOSIS — E7800 Pure hypercholesterolemia, unspecified: Secondary | ICD-10-CM

## 2014-08-28 DIAGNOSIS — R002 Palpitations: Secondary | ICD-10-CM

## 2014-08-28 NOTE — Telephone Encounter (Signed)
Spoke with pt, aware labs orders will be placed and she does not need paperwork. Patient voiced understanding

## 2014-08-28 NOTE — Telephone Encounter (Signed)
Pt called in stating that she usually has labs done when she comes in to see Dr. Loletha Grayer. She stated that those orders can be mailed to her house so that she can get those done before her office visit.   Thanks

## 2014-09-08 ENCOUNTER — Other Ambulatory Visit (HOSPITAL_COMMUNITY)
Admission: RE | Admit: 2014-09-08 | Discharge: 2014-09-08 | Disposition: A | Discharge status: Home / Self Care | Payer: PRIVATE HEALTH INSURANCE | Source: Ambulatory Visit | Attending: Gynecology | Admitting: Gynecology

## 2014-09-08 ENCOUNTER — Encounter: Payer: Self-pay | Admitting: Gynecology

## 2014-09-08 ENCOUNTER — Ambulatory Visit (INDEPENDENT_AMBULATORY_CARE_PROVIDER_SITE_OTHER): Payer: PRIVATE HEALTH INSURANCE | Admitting: Gynecology

## 2014-09-08 VITALS — BP 120/74 | Ht 64.0 in | Wt 157.0 lb

## 2014-09-08 DIAGNOSIS — Z01419 Encounter for gynecological examination (general) (routine) without abnormal findings: Secondary | ICD-10-CM

## 2014-09-08 DIAGNOSIS — Z1151 Encounter for screening for human papillomavirus (HPV): Secondary | ICD-10-CM | POA: Diagnosis present

## 2014-09-08 DIAGNOSIS — N951 Menopausal and female climacteric states: Secondary | ICD-10-CM

## 2014-09-08 LAB — URINALYSIS W MICROSCOPIC + REFLEX CULTURE
Bacteria, UA: NONE SEEN
Bilirubin Urine: NEGATIVE
Casts: NONE SEEN
Crystals: NONE SEEN
Glucose, UA: NEGATIVE mg/dL
Hgb urine dipstick: NEGATIVE
Ketones, ur: NEGATIVE mg/dL
NITRITE: NEGATIVE
Protein, ur: NEGATIVE mg/dL
SPECIFIC GRAVITY, URINE: 1.016 (ref 1.005–1.030)
UROBILINOGEN UA: 0.2 mg/dL (ref 0.0–1.0)
pH: 7.5 (ref 5.0–8.0)

## 2014-09-08 NOTE — Patient Instructions (Signed)
You may obtain a copy of any labs that were done today by logging onto MyChart as outlined in the instructions provided with your AVS (after visit summary). The office will not call with normal lab results but certainly if there are any significant abnormalities then we will contact you.   Health Maintenance, Female A healthy lifestyle and preventative care can promote health and wellness.  Maintain regular health, dental, and eye exams.  Eat a healthy diet. Foods like vegetables, fruits, whole grains, low-fat dairy products, and lean protein foods contain the nutrients you need without too many calories. Decrease your intake of foods high in solid fats, added sugars, and salt. Get information about a proper diet from your caregiver, if necessary.  Regular physical exercise is one of the most important things you can do for your health. Most adults should get at least 150 minutes of moderate-intensity exercise (any activity that increases your heart rate and causes you to sweat) each week. In addition, most adults need muscle-strengthening exercises on 2 or more days a week.   Maintain a healthy weight. The body mass index (BMI) is a screening tool to identify possible weight problems. It provides an estimate of body fat based on height and weight. Your caregiver can help determine your BMI, and can help you achieve or maintain a healthy weight. For adults 20 years and older:  A BMI below 18.5 is considered underweight.  A BMI of 18.5 to 24.9 is normal.  A BMI of 25 to 29.9 is considered overweight.  A BMI of 30 and above is considered obese.  Maintain normal blood lipids and cholesterol by exercising and minimizing your intake of saturated fat. Eat a balanced diet with plenty of fruits and vegetables. Blood tests for lipids and cholesterol should begin at age 61 and be repeated every 5 years. If your lipid or cholesterol levels are high, you are over 50, or you are a high risk for heart  disease, you may need your cholesterol levels checked more frequently.Ongoing high lipid and cholesterol levels should be treated with medicines if diet and exercise are not effective.  If you smoke, find out from your caregiver how to quit. If you do not use tobacco, do not start.  Lung cancer screening is recommended for adults aged 33 80 years who are at high risk for developing lung cancer because of a history of smoking. Yearly low-dose computed tomography (CT) is recommended for people who have at least a 30-pack-year history of smoking and are a current smoker or have quit within the past 15 years. A pack year of smoking is smoking an average of 1 pack of cigarettes a day for 1 year (for example: 1 pack a day for 30 years or 2 packs a day for 15 years). Yearly screening should continue until the smoker has stopped smoking for at least 15 years. Yearly screening should also be stopped for people who develop a health problem that would prevent them from having lung cancer treatment.  If you are pregnant, do not drink alcohol. If you are breastfeeding, be very cautious about drinking alcohol. If you are not pregnant and choose to drink alcohol, do not exceed 1 drink per day. One drink is considered to be 12 ounces (355 mL) of beer, 5 ounces (148 mL) of wine, or 1.5 ounces (44 mL) of liquor.  Avoid use of street drugs. Do not share needles with anyone. Ask for help if you need support or instructions about stopping  the use of drugs.  High blood pressure causes heart disease and increases the risk of stroke. Blood pressure should be checked at least every 1 to 2 years. Ongoing high blood pressure should be treated with medicines, if weight loss and exercise are not effective.  If you are 59 to 55 years old, ask your caregiver if you should take aspirin to prevent strokes.  Diabetes screening involves taking a blood sample to check your fasting blood sugar level. This should be done once every 3  years, after age 91, if you are within normal weight and without risk factors for diabetes. Testing should be considered at a younger age or be carried out more frequently if you are overweight and have at least 1 risk factor for diabetes.  Breast cancer screening is essential preventative care for women. You should practice "breast self-awareness." This means understanding the normal appearance and feel of your breasts and may include breast self-examination. Any changes detected, no matter how small, should be reported to a caregiver. Women in their 66s and 30s should have a clinical breast exam (CBE) by a caregiver as part of a regular health exam every 1 to 3 years. After age 101, women should have a CBE every year. Starting at age 100, women should consider having a mammogram (breast X-ray) every year. Women who have a family history of breast cancer should talk to their caregiver about genetic screening. Women at a high risk of breast cancer should talk to their caregiver about having an MRI and a mammogram every year.  Breast cancer gene (BRCA)-related cancer risk assessment is recommended for women who have family members with BRCA-related cancers. BRCA-related cancers include breast, ovarian, tubal, and peritoneal cancers. Having family members with these cancers may be associated with an increased risk for harmful changes (mutations) in the breast cancer genes BRCA1 and BRCA2. Results of the assessment will determine the need for genetic counseling and BRCA1 and BRCA2 testing.  The Pap test is a screening test for cervical cancer. Women should have a Pap test starting at age 57. Between ages 25 and 35, Pap tests should be repeated every 2 years. Beginning at age 37, you should have a Pap test every 3 years as long as the past 3 Pap tests have been normal. If you had a hysterectomy for a problem that was not cancer or a condition that could lead to cancer, then you no longer need Pap tests. If you are  between ages 50 and 76, and you have had normal Pap tests going back 10 years, you no longer need Pap tests. If you have had past treatment for cervical cancer or a condition that could lead to cancer, you need Pap tests and screening for cancer for at least 20 years after your treatment. If Pap tests have been discontinued, risk factors (such as a new sexual partner) need to be reassessed to determine if screening should be resumed. Some women have medical problems that increase the chance of getting cervical cancer. In these cases, your caregiver may recommend more frequent screening and Pap tests.  The human papillomavirus (HPV) test is an additional test that may be used for cervical cancer screening. The HPV test looks for the virus that can cause the cell changes on the cervix. The cells collected during the Pap test can be tested for HPV. The HPV test could be used to screen women aged 44 years and older, and should be used in women of any age  who have unclear Pap test results. After the age of 55, women should have HPV testing at the same frequency as a Pap test.  Colorectal cancer can be detected and often prevented. Most routine colorectal cancer screening begins at the age of 44 and continues through age 20. However, your caregiver may recommend screening at an earlier age if you have risk factors for colon cancer. On a yearly basis, your caregiver may provide home test kits to check for hidden blood in the stool. Use of a small camera at the end of a tube, to directly examine the colon (sigmoidoscopy or colonoscopy), can detect the earliest forms of colorectal cancer. Talk to your caregiver about this at age 86, when routine screening begins. Direct examination of the colon should be repeated every 5 to 10 years through age 13, unless early forms of pre-cancerous polyps or small growths are found.  Hepatitis C blood testing is recommended for all people born from [birth year] through 1967 and any  individual with known risks for hepatitis C.  Practice safe sex. Use condoms and avoid high-risk sexual practices to reduce the spread of sexually transmitted infections (STIs). Sexually active women aged 36 and younger should be checked for Chlamydia, which is a common sexually transmitted infection. Older women with new or multiple partners should also be tested for Chlamydia. Testing for other STIs is recommended if you are sexually active and at increased risk.  Osteoporosis is a disease in which the bones lose minerals and strength with aging. This can result in serious bone fractures. The risk of osteoporosis can be identified using a bone density scan. Women ages 20 and over and women at risk for fractures or osteoporosis should discuss screening with their caregivers. Ask your caregiver whether you should be taking a calcium supplement or vitamin D to reduce the rate of osteoporosis.  Menopause can be associated with physical symptoms and risks. Hormone replacement therapy is available to decrease symptoms and risks. You should talk to your caregiver about whether hormone replacement therapy is right for you.  Use sunscreen. Apply sunscreen liberally and repeatedly throughout the day. You should seek shade when your shadow is shorter than you. Protect yourself by wearing long sleeves, pants, a wide-brimmed hat, and sunglasses year round, whenever you are outdoors.  Notify your caregiver of new moles or changes in moles, especially if there is a change in shape or color. Also notify your caregiver if a mole is larger than the size of a pencil eraser.  Stay current with your immunizations. Document Released: 06/16/2011 Document Revised: 03/28/2013 Document Reviewed: 06/16/2011 [facility] Patient Information 2014 [employer].

## 2014-09-08 NOTE — Addendum Note (Signed)
Addended by: Nelva Nay on: 09/08/2014 08:31 AM   Modules accepted: Orders

## 2014-09-08 NOTE — Progress Notes (Signed)
SHELLBY SCHLINK 09-Oct-1961 [id number]        53 y.o.  G2P2002 for annual exam.  Several issues noted below.  Past medical history,surgical history, problem list, medications, allergies, family history and social history were all reviewed and documented as reviewed in the EPIC chart.  ROS:  12 system ROS performed with pertinent positives and negatives included in the history, assessment and plan.   Additional significant findings :  none   Exam: Kim Counsellor Vitals:   09/08/14 0757  BP: 120/74  Height: 5\' 4"  (1.626 m)  Weight: 157 lb (71.215 kg)   General appearance:  Normal affect, orientation and appearance. Skin: Grossly normal HEENT: Without gross lesions.  No cervical or supraclavicular adenopathy. Thyroid normal.  Lungs:  Clear without wheezing, rales or rhonchi Cardiac: RR, without RMG Abdominal:  Soft, nontender, without masses, guarding, rebound, organomegaly or hernia Breasts:  Examined lying and sitting without masses, retractions, discharge or axillary adenopathy. Pelvic:  Ext/BUS/vagina normal with mild atrophic changes  Cervix normal with mild atrophic changes. Pap/HPV  Uterus anteverted, normal size, shape and contour, midline and mobile nontender   Adnexa  Without masses or tenderness    Anus and perineum  Normal   Rectovaginal  Normal sphincter tone without palpated masses or tenderness.    Assessment/Plan:  53 y.o. [id number] female for annual exam .   1. Postmenopausal. Mild menopausal symptoms to include hot flashes night sweats. Seem to be getting better over time. Is having some sleep disturbances. Recommended OTC soy based products such as estrogen. Options for trial of HRT also discussed but at this point rejected. We'll follow up if wants to rediscuss. No history of vaginal bleeding. Patient knows to report any vaginal bleeding. 2. Pap smear 2012. Pap/HPV today. No history of significant abnormal Pap smears previously. 3. Mammography 08/2014. Continue  with annual mammography. SBE monthly reviewed. 4. Colonoscopy reported 2007. Recommended repeat interval reported that 10 years. 5. DEXA never. Will plan further into the menopause. Increased calcium and vitamin D. 6. Health maintenance. Patient reports routine lab work done through her primary physician's office. Follow up in one year, sooner as needed.     Anastasio Auerbach MD, 8:19 AM 09/08/2014

## 2014-09-10 LAB — URINE CULTURE: Colony Count: >=100000

## 2014-09-11 LAB — CYTOLOGY - PAP

## 2014-09-15 LAB — COMPREHENSIVE METABOLIC PANEL
ALBUMIN: 4.7 g/dL (ref 3.5–5.2)
ALK PHOS: 68 U/L (ref 39–117)
ALT: 18 U/L (ref 0–35)
AST: 13 U/L (ref 0–37)
BUN: 19 mg/dL (ref 6–23)
CALCIUM: 9.6 mg/dL (ref 8.4–10.5)
CHLORIDE: 106 meq/L (ref 96–112)
CO2: 21 mEq/L (ref 19–32)
Creat: 0.75 mg/dL (ref 0.50–1.10)
Glucose, Bld: 95 mg/dL (ref 70–99)
Potassium: 4.2 mEq/L (ref 3.5–5.3)
Sodium: 138 mEq/L (ref 135–145)
Total Bilirubin: 0.5 mg/dL (ref 0.2–1.2)
Total Protein: 6.7 g/dL (ref 6.0–8.3)

## 2014-09-15 LAB — CBC
HCT: 42.5 % (ref 36.0–46.0)
HEMOGLOBIN: 14.2 g/dL (ref 12.0–15.0)
MCH: 29.5 pg (ref 26.0–34.0)
MCHC: 33.4 g/dL (ref 30.0–36.0)
MCV: 88.2 fL (ref 78.0–100.0)
PLATELETS: 205 10*3/uL (ref 150–400)
RBC: 4.82 MIL/uL (ref 3.87–5.11)
RDW: 13.8 % (ref 11.5–15.5)
WBC: 5.4 10*3/uL (ref 4.0–10.5)

## 2014-09-15 LAB — LIPID PANEL
Cholesterol: 192 mg/dL (ref 0–200)
HDL: 75 mg/dL (ref >39)
LDL CALC: 107 mg/dL — AB (ref 0–99)
Total CHOL/HDL Ratio: 2.6 Ratio
Triglycerides: 52 mg/dL (ref <150)
VLDL: 10 mg/dL (ref 0–40)

## 2014-09-15 LAB — TSH: TSH: 0.778 u[IU]/mL (ref 0.350–4.500)

## 2014-09-21 ENCOUNTER — Ambulatory Visit: Payer: PRIVATE HEALTH INSURANCE | Admitting: Nurse Practitioner

## 2014-10-02 ENCOUNTER — Encounter: Payer: Self-pay | Admitting: Cardiovascular Disease

## 2014-10-02 ENCOUNTER — Ambulatory Visit (INDEPENDENT_AMBULATORY_CARE_PROVIDER_SITE_OTHER): Payer: PRIVATE HEALTH INSURANCE | Admitting: Cardiovascular Disease

## 2014-10-02 VITALS — BP 122/74 | HR 82 | Resp 16 | Ht 64.0 in | Wt 159.2 lb

## 2014-10-02 DIAGNOSIS — E7800 Pure hypercholesterolemia, unspecified: Secondary | ICD-10-CM

## 2014-10-02 DIAGNOSIS — R002 Palpitations: Secondary | ICD-10-CM

## 2014-10-02 NOTE — Patient Instructions (Signed)
Dr. Croitoru recommends that you schedule a follow-up appointment in: One Year.   

## 2014-10-03 ENCOUNTER — Encounter: Payer: Self-pay | Admitting: Cardiovascular Disease

## 2014-10-03 NOTE — Progress Notes (Signed)
Patient ID: Julie Lucero, female   DOB: [birth year], 52 y.o.   MRN: [id number]     Reason for office visit Hypercholesterolemia  Julie Lucero continues to feel very well. She is still exercising regularly including periodic intense exercise with "boot camp" programs at [employer]. She enjoys exercising but unfortunately has had a little weight gain compared to last year - her BMI has increased from 26-27. Her migraines are relatively well controlled. Her cholesterol has increased slightly with an LDL that is gone to 107. She continues to have an excellent HDL. Her blood pressure is normal. Her echocardiogram today is also normal. She has not required any medication for high blood pressure since 2012. She continues to pay a lot of attention to calorie and sodium content of her diet. She has very infrequent palpitations. She prefers not to take medication for them.   No Known Allergies  Current Outpatient Prescriptions  Medication Sig Dispense Refill  . polyethylene glycol (MIRALAX / GLYCOLAX) packet Take 17 g by mouth daily.        . rizatriptan (MAXALT) 10 MG tablet Take 1 tablet (10 mg total) by mouth as needed for migraine.  27 tablet  3  . simvastatin (ZOCOR) 20 MG tablet TAKE 1 TABLET BY MOUTH ATBEDTIME  90 tablet  2  . topiramate (TOPAMAX) 50 MG tablet Take 1 tablet (50 mg total) by mouth 2 (two) times daily.  180 tablet  3   No current facility-administered medications for this visit.    Past Medical History  Diagnosis Date  . Endometrial polyp   . Anal fissure 2006  . Elevated cholesterol   . Sleep disturbance     Past Surgical History  Procedure Laterality Date  . Wrist surgery    . Doppler echocardiography  04/27/2008    EF =>55%;lv fx normal  . Dilation and curettage of uterus  2009    endometrial polyp    Family History  Problem Relation Age of Onset  . Diabetes Mother   . Cancer Mother     PANCREATIC  . Hypertension Father   . Heart disease Father   . Hypertension  Sister   . Diabetes Paternal Grandmother   . Diabetes Paternal Grandfather   . Hypertension Paternal Grandfather   . Heart disease Paternal Grandfather   . Diabetes Brother     History   Social History  . Marital Status: Married    Spouse Name: N/A    Number of Children: N/A  . Years of Education: N/A   Occupational History  . Not on file.   Social History Main Topics  . Smoking status: Former Research scientist (life sciences)  . Smokeless tobacco: Never Used  . Alcohol Use: Yes     Comment: Rare  . Drug Use: No  . Sexual Activity: Yes    Birth Control/ Protection: Post-menopausal     Comment: Vasectomy   Other Topics Concern  . Not on file   Social History Narrative  . No narrative on file    Review of systems: The patient specifically denies any chest pain at rest or with exertion, dyspnea at rest or with exertion, orthopnea, paroxysmal nocturnal dyspnea, syncope, palpitations, focal neurological deficits, intermittent claudication, lower extremity edema, unexplained weight gain, cough, hemoptysis or wheezing.  The patient also denies abdominal pain, nausea, vomiting, dysphagia, diarrhea, constipation, polyuria, polydipsia, dysuria, hematuria, frequency, urgency, abnormal bleeding or bruising, fever, chills, unexpected weight changes, mood swings, change in skin or hair texture, change in voice quality,  auditory or visual problems, allergic reactions or rashes, new musculoskeletal complaints other than usual "aches and pains".   PHYSICAL EXAM BP 122/74  Pulse 82  Resp 16  Ht 5\' 4"  (1.626 m)  Wt 72.213 kg (159 lb 3.2 oz)  BMI 27.31 kg/m2  General: Alert, oriented x3, no distress Head: no evidence of trauma, PERRL, EOMI, no exophtalmos or lid lag, no myxedema, no xanthelasma; normal ears, nose and oropharynx Neck: normal jugular venous pulsations and no hepatojugular reflux; brisk carotid pulses without delay and no carotid bruits Chest: clear to auscultation, no signs of consolidation by  percussion or palpation, normal fremitus, symmetrical and full respiratory excursions Cardiovascular: normal position and quality of the apical impulse, regular rhythm, normal first and second heart sounds, no murmurs, rubs or gallops Abdomen: no tenderness or distention, no masses by palpation, no abnormal pulsatility or arterial bruits, normal bowel sounds, no hepatosplenomegaly Extremities: no clubbing, cyanosis or edema; 2+ radial, ulnar and brachial pulses bilaterally; 2+ right femoral, posterior tibial and dorsalis pedis pulses; 2+ left femoral, posterior tibial and dorsalis pedis pulses; no subclavian or femoral bruits Neurological: grossly nonfocal   EKG: Normal sinus rhythm  Lipid Panel     Component Value Date/Time   CHOL 192 09/15/2014 1105   TRIG 52 09/15/2014 1105   HDL 75 09/15/2014 1105   CHOLHDL 2.6 09/15/2014 1105   VLDL 10 09/15/2014 1105   LDLCALC 107* 09/15/2014 1105    BMET    Component Value Date/Time   NA 138 09/15/2014 1103   K 4.2 09/15/2014 1103   CL 106 09/15/2014 1103   CO2 21 09/15/2014 1103   GLUCOSE 95 09/15/2014 1103   BUN 19 09/15/2014 1103   CREATININE 0.75 09/15/2014 1103   CALCIUM 9.6 09/15/2014 1103     ASSESSMENT AND PLAN  Julie Lucero has done a Dance movement psychotherapist job of controlling her hypertension and hyperlipidemia with diet and exercise. She has gained a little weight back compared to last year and this shows in the slight increase in her LDL cholesterol, although her HDL remains superb. I encouraged her to try to lose the slight weight she gained. She'll continue to followup on a yearly basis.  She is wanting to establish a primary care physician and would be delighted to see Dr. Trilby Drummer, who is her husband's primary care provider. I think this would be an excellent choice.  Orders Placed This Encounter  Procedures  . EKG 12-Lead   No orders of the defined types were placed in this encounter.    Holli Humbles, MD, [place] [phone] office [phone] pager

## 2014-10-16 ENCOUNTER — Encounter: Payer: Self-pay | Admitting: Cardiovascular Disease

## 2014-10-19 ENCOUNTER — Ambulatory Visit: Payer: PRIVATE HEALTH INSURANCE | Admitting: Neurology

## 2014-10-23 ENCOUNTER — Ambulatory Visit: Payer: Self-pay | Admitting: Neurology

## 2014-11-08 ENCOUNTER — Ambulatory Visit: Payer: Self-pay | Admitting: Neurology

## 2014-11-20 ENCOUNTER — Other Ambulatory Visit: Payer: Self-pay | Admitting: Nurse Practitioner

## 2014-11-20 ENCOUNTER — Other Ambulatory Visit: Payer: Self-pay | Admitting: Cardiovascular Disease

## 2014-11-20 NOTE — Telephone Encounter (Signed)
Rx has been sent to the pharmacy electronically. ° °

## 2014-11-20 NOTE — Telephone Encounter (Signed)
Patient has appt scheduled

## 2014-12-27 ENCOUNTER — Ambulatory Visit: Payer: Self-pay | Admitting: Neurology

## 2015-01-24 ENCOUNTER — Ambulatory Visit: Payer: Self-pay | Admitting: Neurology

## 2015-02-20 ENCOUNTER — Other Ambulatory Visit: Payer: Self-pay | Admitting: Dermatology

## 2015-02-27 ENCOUNTER — Encounter: Payer: Self-pay | Admitting: Neurology

## 2015-02-27 ENCOUNTER — Ambulatory Visit (INDEPENDENT_AMBULATORY_CARE_PROVIDER_SITE_OTHER): Payer: PRIVATE HEALTH INSURANCE | Admitting: Neurology

## 2015-02-27 DIAGNOSIS — N943 Premenstrual tension syndrome: Secondary | ICD-10-CM | POA: Diagnosis not present

## 2015-02-27 DIAGNOSIS — G43109 Migraine with aura, not intractable, without status migrainosus: Secondary | ICD-10-CM | POA: Insufficient documentation

## 2015-02-27 DIAGNOSIS — G43831 Menstrual migraine, intractable, with status migrainosus: Secondary | ICD-10-CM

## 2015-02-27 MED ORDER — RIZATRIPTAN BENZOATE 10 MG PO TABS: 10.0000 mg | ORAL_TABLET | ORAL | Status: DC | PRN

## 2015-02-27 MED ORDER — TOPIRAMATE ER 100 MG PO CAP24: 100.0000 mg | ORAL_CAPSULE | Freq: Every evening | ORAL | Status: DC

## 2015-02-27 NOTE — Progress Notes (Signed)
[facility]  PATIENT: Julie Lucero DOB: September 16, 1961   REASON FOR VISIT: Followup for migraine   HISTORY OF PRESENT ILLNESS:Ms Julie Lucero, 54 yr old returns for follow up. Headache frequency about  1-2/month. Topamax working well. Did not get response from [facility]. Maxalt is working and it is generic  Relpax too costly. She needs refills today. Recent labs reviewed 09-2014- no new labs needed. No medication changes needed. She had meanwhile one status migrainosus.      HISTORY: Referral  for migraine management per Dr. Cherylann Banas, GYN.  Has migrainous HA at least once every ten days, and feels impaired from frequency.  Headaches wake her up from sleep.   Maxalt gives relief but  often headaches exceed the amount of Maxalt she can take within a day.  She gets nausea, photophobia and phonophobia. Mostly manifesting  on left forehead and radiates to temple and occiput.  Her brother used to work for [employer] and she felt it worked very well until the past year.   Teenage onset - "I always had headaches"  and her daughter has had these too, since teenage   REVIEW OF SYSTEMS: Full 14 system review of systems performed and notable only for:   PAST MEDICAL HISTORY: Past Medical History  Diagnosis Date  . Endometrial polyp   . Anal fissure 2006  . Elevated cholesterol   . Sleep disturbance   . Headache     PAST SURGICAL HISTORY: Past Surgical History  Procedure Laterality Date  . Wrist surgery    . Doppler echocardiography  04/27/2008    EF =>55%;lv fx normal  . Dilation and curettage of uterus  2009    endometrial polyp    FAMILY HISTORY: Family History  Problem Relation Age of Onset  . Diabetes Mother   . Cancer Mother     PANCREATIC  . Hypertension Father   . Heart disease Father   . Hypertension Sister   . Diabetes Paternal Grandmother   . Diabetes Paternal Grandfather   . Hypertension Paternal Grandfather   . Heart disease Paternal Grandfather    . Diabetes Brother     SOCIAL HISTORY: History   Social History  . Marital Status: Married    Spouse Name: N/A    Number of Children: 2  . Years of Education: HS grad   Occupational History  . Radiologic technician. [employer]   Social History Main Topics  . Smoking status: Former Research scientist (life sciences)  . Smokeless tobacco: Never Used  . Alcohol Use: Yes     Comment: Rare  . Drug Use: No  . Sexual Activity: Yes    Birth Control/ Protection: Post-menopausal     Comment: Vasectomy   Other Topics Concern  . Not on file   Social History Narrative  . No narrative on file     PHYSICAL EXAM  Filed Vitals:   02/27/15 1552  BP: 122/83  Pulse: 77  Resp: 16  Height: 5' 3.25" (1.607 m)  Weight: 159 lb (72.122 kg)   Body mass index is 27.93 kg/(m^2).  Generalized: Well developed, in no acute distress   Neurological examination   Mentation: Alert oriented to time, place, history taking. Follows all commands speech and language fluent  Cranial nerve   Taste and smell  intact . Pupils were equal round reactive to light . extraocular movements were full, visual field were full on confrontational test.  Facial sensation and strength were normal.  Hearing was intact to finger  rubbing bilaterally. Uvula tongue midline. head turning and shoulder shrug and were normal and symmetric.Tongue protrusion into cheek strength was normal. Motor: normal bulk and tone, full strength in the BUE, BLE, fine finger movements normal, no pronator drift. No focal weakness Coordination: finger-nose-finger, heel-to-shin bilaterally, no dysmetria Reflexes:  2/2, plantar responses were flexor bilaterally. Gait and Station: Rising up from seated position without assistance, normal stance, moderate stride, good arm swing, smooth turning, able to perform tiptoe, and heel walking without difficulty. Tandem gait normal  DIAGNOSTIC DATA (LABS, IMAGING, TESTING) - I reviewed patient records, labs, notes,  testing and imaging myself where available.  54 y.o. year old female  has a diagnosis of migraines, menstrual with  status. Not intractable.  here for followup.   Continue Topamax at current dose Continue maxalt acutely Will refill meds for 90 days, I  advised to try elimination diet, start with Gluten elimination.  She noted migraine status was associated with constipation and bloating.  F/U yearly with Dennie Bible Avala, [facility] [address], [place], [place] [address] [phone]

## 2015-02-27 NOTE — Patient Instructions (Signed)
Gluten-Free Diet for Celiac Disease Gluten is a protein found in wheat, rye, barley, and triticale (a cross between wheat and rye) grains. People with celiac disease need to have a gluten-free diet. With celiac disease, gluten interferes with the absorption of food and may also cause intestinal injury.  Strict compliance is important even during symptom-free periods. This means eliminating all foods with gluten from your diet permanently. This requires some significant changes but is very manageable. WHAT DO I NEED TO KNOW ABOUT A GLUTEN-FREE DIET?  Look for items labeled with "GF." Looking for GF will make it easier to identify products that are safe to eat.  Read all labels. Gluten may have been added as a minor ingredient where least expected, such as in shredded cheeses or ice creams. Always check food labels and investigate questionable ingredients. Talk to your dietitian or health care provider if you have questions about certain foods or need help finding GF foods.  Check when in doubt. If you are not sure whether an ingredient contains gluten, check with the manufacturer. Note that some manufacturers may change ingredients without notice. Always read labels.   Know how food is prepared. Since flour and cereal products are often used in the preparation of foods, it is important to be aware of the methods of preparation used, as well as the ingredients in the foods themselves. This is especially true when you are dining out. Ask restaurants if they have a gluten-free menu.  Watch for cross-contamination. Cross-contamination occurs when gluten-free foods come into contact with foods that contain gluten. It often happens during the manufacturing process. Always check the ingredient list and for warnings on packages, such as "may contain gluten."  Eat a balanced diet. It is important to still get enough fiber, iron, and B vitamins in your diet. Look for enriched whole grain gluten-free products  and continue to eat a well-balanced diet of the important non-grain items, such as vegetables, fruit, lean proteins, legumes, and dairy.  Consider taking a gluten-free multivitamin and mineral supplement. Discuss this with your health care provider. WHAT KEY WORDS HELP IDENTIFY GLUTEN? Know key words to help identify gluten. A dietitian can help you identify possible harmful ingredients in the foods you normally eat. Words to check for on food labels include:   Flour, enriched flour, bromated flour, white flour, durum flour, graham flour, phosphated flour, self-rising flour, semolina, or farina.  Starch, dextrin, modified food starch, or cereal.  Thickening, fillers, or emulsifiers.  Any kind of malt flavoring, extract, or syrup (malt is made from barley and includes malt vinegar, malted milk, and malted beverages).  Hydrolyzed vegetable protein. WHAT FOODS CAN I EAT? Below is a list of common foods that are allowed with a gluten-free diet.  Grains Products made from the following flours or grains:amaranth,bean flours, 100% buckwheat flour, corn, millet, nut flours or meals, GF oats, quinoa, rice, sorghum, teff, any all-purpose 100% GF flour mix, rice wafers, pure cornmeal tortillas, popcorn, some crackers, some chips, and hot cereals made from cornmeal. Ask your dietitian which specific hot and cold cereals are allowed. Hominy, rice or wild rice, and special GF pasta. Some Asian rice noodles or bean noodles. Arrowroot starch, corn bran, corn flour, corn germ, cornmeal, corn starch, potato flour, potato starch flour, and rice bran. Rice flours: plain, brown, and sweet. Rice polish, soy flour, tapioca starch. Vegetables All plain, fresh, frozen, or canned vegetables.  Fruits All fresh, frozen, canned, dried fruits, and fruit juices.  Meats and Other   Protein Foods Meat, fish, poultry, or eggs prepared without added wheat, rye, barley, or triticale. Some luncheon meat and some frankfurters.  Pure meat. All aged cheese, most processed cheese products, some cottage cheese, and some cream cheese. Dried beans, dried peas, and lentils.  Dairy Milk and yogurt made with allowed ingredients.  Beverages Coffee (regular or decaffeinated), tea, herbal tea (read label to be sure that no wheat flour has been added). Carbonated beverages and some root beers. Wine, sake, and distilled spirits, such as gin, vodka, and whiskey. GF beers and GF ciders.  Sweetsand Desserts Sugar, honey, some syrups, molasses, jelly, jam, plain hard candy, marshmallows, gumdrops, homemade candies free of wheat, rye, barley, or triticale. Coconut. Custard, some pudding mixes, and homemade puddings from cornstarch, rice, and tapioca. Gelatin desserts, sorbets, frozen ice pops, and sherbet. Cake, cookies, and other desserts prepared with allowed flours. Some commercial ice creams. Ask your dietitian about specific brands of dessert that are allowed.  Fats and Oils Butter, margarine, vegetable oil, sour cream not containing modified food starch, whipping cream, shortening, lard, cream, and some mayonnaise. Some commercial salad dressings. Peanut butter.  Other Homemade broth and soups made with allowed ingredients; some canned or frozen soups. Any other combination or prepared foods that do not contain gluten. Monosodium glutamate (MSG). Cider, rice, and wine vinegar. Baking soda and baking powder. Certain soy sauces (Tamari). Ask your dietitian about specific brands that are allowed. Nuts, coconut, chocolate, and pure cocoa powder. Salt, pepper, herbs, spices, extracts, and food colorings. The items listed above may not be a complete list of allowed foods or beverages. Contact your dietitian for more options.  WHAT FOODS CAN I NOT EAT? Below is a list of common foods that are not allowed with a gluten-free diet.  Grains Barley, bran, bulgur, cracked wheat, graham, malt, matzo, wheat germ, and all wheat and rye cereals  including spelt and kamut. Avoid cereals containing malt as a flavoring, such as rice cereal. Also avoid regular noodles, spaghetti, macaroni, and most packaged rice mixes, and all others containing wheat, rye, barley, or triticale.  Vegetables Most creamed vegetables, most vegetables canned in sauces, and any vegetables prepared with wheat, rye, barley, or triticale.  Fruits Thickened or prepared fruits and some pie fillings.  Meats and Other Protein Sources Any meat or meat alternative containing wheat, rye, barley, or gluten stabilizers (such as some hot dogs, salami, cold cuts, or sausage). Bread-containing products, such as Swiss steak, croquettes, and meatloaf. Most tuna canned in vegetable broth, turkey with hydrolyzed vegetable protein (HVP) injected as part of the basting, and any cheese product containing oat gum as an ingredient. Seitan. Imitation fish. Dairy Commercial chocolate milk, which may have cereal added, and malted milk. Beverages Certain cereal beverages. Beer and ciders (unless GF), ale, malted milk, and some root beers. Sweetsand Desserts Commercial candies containing wheat, rye, barley, or triticale. Certain toffees are dusted with wheat flour. Chocolate-coated nuts, which are often rolled in flour. Cakes, cookies, doughnuts, and pastries that are prepared with wheat, barley, rye, or triticale flour. Some commercial ice creams, ice cream flavors which contain cookies, crumbs, or cheesecake. Ice cream cones. Commercially prepared mixes for cakes, cookies, and other desserts unless marked GF. Bread pudding and other puddings thickened with flour. Fats and Oils Some commercial salad dressings and sour cream containing modified food starch.  Condiments Some curry powder, some dry seasoning mixes, some gravy extracts, some meat sauces, some ketchup, some prepared mustard, horseradish. Other All soups containing wheat,   rye, barley, or triticale flour. Bouillon and bouillon  cubes that contain HVP. Combination or prepared foods that contain gluten. Some soy sauce, some chip dips, and some chewing gum. Yeast extract (contains barley). Caramel color (may contain malt). The items listed above may not be a complete list of foods and beverages to avoid. Contact your dietitian for more information. Document Released: 12/01/2005 Document Revised: 04/17/2014 Document Reviewed: 10/05/2013 Exit[employer] Patient Information 2015 [employer]. This information is not intended to replace advice given to you by your health care provider. Make sure you discuss any questions you have with your health care provider.  

## 2015-02-28 ENCOUNTER — Telehealth: Payer: Self-pay | Admitting: *Deleted

## 2015-02-28 MED ORDER — TOPIRAMATE 50 MG PO TABS: 50.0000 mg | ORAL_TABLET | Freq: Two times a day (BID) | ORAL | Status: DC

## 2015-02-28 NOTE — Telephone Encounter (Signed)
Spoke to pt and she will go back to generic topamax and the other is too expensive.

## 2015-02-28 NOTE — Telephone Encounter (Signed)
Trokendi extended release is costing to much. The pharmacy is asking for something for affordable.

## 2015-02-28 NOTE — Telephone Encounter (Signed)
Go back to generic topamax?

## 2015-02-28 NOTE — Telephone Encounter (Signed)
If not covered, yes. Go back. CD

## 2015-03-01 NOTE — Telephone Encounter (Signed)
Faxed to [facility] [phone].

## 2015-03-14 ENCOUNTER — Telehealth: Payer: Self-pay | Admitting: Nurse Practitioner

## 2015-03-14 NOTE — Telephone Encounter (Signed)
By viewing previous note, it appears Julie Lucero spoke with Dr Brett Fairy and changed the patient from Tpx ER to regular release generic Tpx due to cost.  I called back.  She said she has picked up the Topamax, and if her situation changes in the future, she may like to try Tpx ER.  Said due to her ins, if she currently tried to get this Rx it would be $1200, because she cannot bill ins, has to use [employer] account.  Advised if anything changes, she can call us back, and we could most certainly ask Dr Brett Fairy to change her back to ER version.

## 2015-03-14 NOTE — Telephone Encounter (Signed)
Patient is calling to discuss a generic for Topamax XR. Please call. Thank you.

## 2015-07-18 ENCOUNTER — Other Ambulatory Visit: Payer: Self-pay | Admitting: Nurse Practitioner

## 2015-08-02 ENCOUNTER — Other Ambulatory Visit: Payer: Self-pay

## 2015-08-02 ENCOUNTER — Other Ambulatory Visit: Payer: Self-pay | Admitting: Cardiovascular Disease

## 2015-08-02 NOTE — Telephone Encounter (Signed)
Rx(s) sent to pharmacy electronically.  

## 2015-08-10 ENCOUNTER — Telehealth: Payer: Self-pay | Admitting: *Deleted

## 2015-08-10 DIAGNOSIS — Z01419 Encounter for gynecological examination (general) (routine) without abnormal findings: Secondary | ICD-10-CM

## 2015-08-10 NOTE — Telephone Encounter (Signed)
Pt scheduled for annual on 09/15/15 wants labs done prior to annual. Please advise

## 2015-08-10 NOTE — Telephone Encounter (Signed)
Is her annual with me? CBC, CMET, lipid panel, TSH, vitamin D, Dr. Phineas Real saw her last year, and it looks like she had her her labs at her primary care?. If she is seeing me for her annual -  those labs would be appropriate.

## 2015-08-10 NOTE — Telephone Encounter (Signed)
Yes patient is seeing nancy on 09/18/15, order placed, left on pt voicemail to make lab appointment.

## 2015-09-17 ENCOUNTER — Other Ambulatory Visit: Payer: Self-pay | Admitting: Women's Health

## 2015-09-18 ENCOUNTER — Encounter: Payer: PRIVATE HEALTH INSURANCE | Admitting: Women's Health

## 2015-10-03 ENCOUNTER — Other Ambulatory Visit: Payer: PRIVATE HEALTH INSURANCE

## 2015-10-03 DIAGNOSIS — Z01419 Encounter for gynecological examination (general) (routine) without abnormal findings: Secondary | ICD-10-CM

## 2015-10-03 LAB — LIPID PANEL
CHOL/HDL RATIO: 2.6 ratio (ref <=5.0)
CHOLESTEROL: 189 mg/dL (ref 125–200)
HDL: 74 mg/dL (ref >=46)
LDL Cholesterol: 100 mg/dL (ref <130)
Triglycerides: 74 mg/dL (ref <150)
VLDL: 15 mg/dL (ref <30)

## 2015-10-03 LAB — CBC
HCT: 42.8 % (ref 36.0–46.0)
Hemoglobin: 14 g/dL (ref 12.0–15.0)
MCH: 29.6 pg (ref 26.0–34.0)
MCHC: 32.7 g/dL (ref 30.0–36.0)
MCV: 90.5 fL (ref 78.0–100.0)
MPV: 12.1 fL (ref 8.6–12.4)
Platelets: 215 10*3/uL (ref 150–400)
RBC: 4.73 MIL/uL (ref 3.87–5.11)
RDW: 13.6 % (ref 11.5–15.5)
WBC: 5.9 10*3/uL (ref 4.0–10.5)

## 2015-10-03 LAB — COMPREHENSIVE METABOLIC PANEL
ALK PHOS: 58 U/L (ref 33–130)
ALT: 14 U/L (ref 6–29)
AST: 13 U/L (ref 10–35)
Albumin: 4.5 g/dL (ref 3.6–5.1)
BUN: 23 mg/dL (ref 7–25)
CO2: 25 mmol/L (ref 20–31)
CREATININE: 0.8 mg/dL (ref 0.50–1.05)
Calcium: 9.6 mg/dL (ref 8.6–10.4)
Chloride: 107 mmol/L (ref 98–110)
GLUCOSE: 95 mg/dL (ref 65–99)
POTASSIUM: 4.3 mmol/L (ref 3.5–5.3)
SODIUM: 139 mmol/L (ref 135–146)
TOTAL PROTEIN: 6.7 g/dL (ref 6.1–8.1)
Total Bilirubin: 0.5 mg/dL (ref 0.2–1.2)

## 2015-10-03 LAB — TSH: TSH: 0.874 u[IU]/mL (ref 0.350–4.500)

## 2015-10-04 ENCOUNTER — Encounter: Payer: Self-pay | Admitting: Women's Health

## 2015-10-04 ENCOUNTER — Ambulatory Visit (INDEPENDENT_AMBULATORY_CARE_PROVIDER_SITE_OTHER): Payer: PRIVATE HEALTH INSURANCE | Admitting: Women's Health

## 2015-10-04 VITALS — BP 140/80 | Ht 63.0 in

## 2015-10-04 DIAGNOSIS — Z01419 Encounter for gynecological examination (general) (routine) without abnormal findings: Secondary | ICD-10-CM | POA: Diagnosis not present

## 2015-10-04 LAB — VITAMIN D 25 HYDROXY (VIT D DEFICIENCY, FRACTURES): VIT D 25 HYDROXY: 29 ng/mL — AB (ref 30–100)

## 2015-10-04 NOTE — Progress Notes (Signed)
TINIKA BUCKNAM 19-Jan-1961 [id number]    History:    Presents for annual exam.  Postmenopausal/ no HRT/no bleeding. Normal Pap and mammogram history. 2007 negative colonoscopy done due to having some left lower quadrant pain that has resolved.  Past medical history, past surgical history, family history and social history were all reviewed and documented in the EPIC chart. 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 2 children both doing well ages 47 and 63. Mother died from pancreatic cancer. Father heart disease.  ROS:  A ROS was performed and pertinent positives and negatives are included.  Exam:  Filed Vitals:   10/04/15 0825  BP: 140/80    General appearance:  Normal Thyroid:  Symmetrical, normal in size, without palpable masses or nodularity. Respiratory  Auscultation:  Clear without wheezing or rhonchi Cardiovascular  Auscultation:  Regular rate, without rubs, murmurs or gallops  Edema/varicosities:  Not grossly evident Abdominal  Soft,nontender, without masses, guarding or rebound.  Liver/spleen:  No organomegaly noted  Hernia:  None appreciated  Skin  Inspection:  Grossly normal   Breasts: Examined lying and sitting.     Right: Without masses, retractions, discharge or axillary adenopathy.     Left: Without masses, retractions, discharge or axillary adenopathy. Gentitourinary   Inguinal/mons:  Normal without inguinal adenopathy  External genitalia:  Normal  BUS/Urethra/Skene's glands:  Normal  Vagina:  Normal  Cervix:  Normal  Uterus:  normal in size, shape and contour.  Midline and mobile  Adnexa/parametria:     Rt: Without masses or tenderness.   Lt: Without masses or tenderness.  Anus and perineum: Normal  Digital rectal exam: Normal sphincter tone without palpated masses or tenderness  Assessment/Plan:  54 y.o. MWF G2 P2 for annual exam with no complaints.  Postmenopausal/no bleeding/no HRT Borderline blood pressure lower with retake History of  migraines Hypercholesteremia-Primary care manages labs and meds  Plan: Reviewed importance of following up with blood pressure relates to rushing. If persists greater than 130/80 instructed to follow-up with primary care. SBE's, continue annual 3-D screening mammogram, exercise, calcium rich diet, vitamin D 2000 daily. Instructed to have vitamin D level checked at primary care with next blood draw. Pap normal with negative HR HPV 2015, new screening guidelines reviewed.  Lely, 5:29 PM 10/04/2015

## 2015-10-04 NOTE — Patient Instructions (Signed)

## 2015-10-05 LAB — URINALYSIS W MICROSCOPIC + REFLEX CULTURE
BACTERIA UA: NONE SEEN [HPF]
Bilirubin Urine: NEGATIVE
Casts: NONE SEEN [LPF]
Crystals: NONE SEEN [HPF]
GLUCOSE, UA: NEGATIVE
HGB URINE DIPSTICK: NEGATIVE
Ketones, ur: NEGATIVE
LEUKOCYTES UA: NEGATIVE
NITRITE: NEGATIVE
PH: 5.5 (ref 5.0–8.0)
PROTEIN: NEGATIVE
RBC / HPF: NONE SEEN RBC/HPF (ref <=2)
Specific Gravity, Urine: 1.009 (ref 1.001–1.035)
Squamous Epithelial / LPF: NONE SEEN [HPF] (ref <=5)
WBC, UA: NONE SEEN WBC/HPF (ref <=5)
YEAST: NONE SEEN [HPF]

## 2015-10-10 ENCOUNTER — Ambulatory Visit (INDEPENDENT_AMBULATORY_CARE_PROVIDER_SITE_OTHER): Payer: PRIVATE HEALTH INSURANCE | Admitting: *Deleted

## 2015-10-10 DIAGNOSIS — Z23 Encounter for immunization: Secondary | ICD-10-CM | POA: Diagnosis not present

## 2015-11-14 ENCOUNTER — Other Ambulatory Visit: Payer: Self-pay | Admitting: Cardiovascular Disease

## 2016-02-28 ENCOUNTER — Ambulatory Visit (INDEPENDENT_AMBULATORY_CARE_PROVIDER_SITE_OTHER): Payer: PRIVATE HEALTH INSURANCE | Admitting: Nurse Practitioner

## 2016-02-28 ENCOUNTER — Encounter: Payer: Self-pay | Admitting: Nurse Practitioner

## 2016-02-28 VITALS — BP 151/88 | HR 70 | Ht 63.0 in

## 2016-02-28 DIAGNOSIS — G43831 Menstrual migraine, intractable, with status migrainosus: Secondary | ICD-10-CM

## 2016-02-28 DIAGNOSIS — N943 Premenstrual tension syndrome: Secondary | ICD-10-CM | POA: Diagnosis not present

## 2016-02-28 MED ORDER — RIZATRIPTAN BENZOATE 10 MG PO TABS
ORAL_TABLET | ORAL | Status: DC
Start: 2016-02-28 — End: 2018-10-20

## 2016-02-28 MED ORDER — TOPIRAMATE 50 MG PO TABS: 50.0000 mg | ORAL_TABLET | Freq: Two times a day (BID) | ORAL | Status: DC

## 2016-02-28 NOTE — Progress Notes (Signed)
I agree with the assessment and plan as directed by NP .The patient is known to me .   Randel Hargens, MD  

## 2016-02-28 NOTE — Progress Notes (Signed)
[facility]  PATIENT: Julie Lucero DOB: 08/19/1961   REASON FOR VISIT: Follow-up for menstrual migraines HISTORY FROM: Patient    HISTORY OF PRESENT ILLNESS:Ms Moede, 55 yr old returns for yearly follow up. Headache frequency about 1-2/month. Trigger is weather changes .Topamax working well. Did not get response from [facility]. Maxalt is working and it is generic and more affordable.  She needs refills today. Labs are followed by PCP. She returns for reevaluation. She is doing well   HISTORY:CD Referral for migraine management per Dr. Cherylann Banas, GYN.  Has migrainous HA at least once every ten days, and feels impaired from frequency. Headaches wake her up from sleep.  Maxalt gives relief but often headaches exceed the amount of Maxalt she can take within a day. She gets nausea, photophobia and phonophobia. Mostly manifesting on left forehead and radiates to temple and occiput.  Her brother used to work for [employer] and she felt it worked very well until the past year.  Teenage onset - "I always had headaches" and her daughter has had these too, since teenage   REVIEW OF SYSTEMS: Full 14 system review of systems performed and notable only for those listed, all others are neg:  Constitutional: neg  Cardiovascular: neg Ear/Nose/Throat: neg  Skin: neg Eyes: neg Respiratory: neg Gastroitestinal: neg  Hematology/Lymphatic: neg  Endocrine: neg Musculoskeletal:neg Allergy/Immunology: neg Neurological: neg Psychiatric: neg Sleep : neg   ALLERGIES: No Known Allergies  HOME MEDICATIONS: Outpatient Prescriptions Prior to Visit  Medication Sig Dispense Refill  . polyethylene glycol (MIRALAX / GLYCOLAX) packet Take 17 g by mouth daily.      . rizatriptan (MAXALT) 10 MG tablet TAKE 1 TABLET BY MOUTH ASNEEDED FOR MIGRAINE. 27 tablet 1  . topiramate (TOPAMAX) 50 MG tablet Take 1 tablet (50 mg total) by mouth 2 (two) times daily. 180 tablet 3  .  simvastatin (ZOCOR) 20 MG tablet Take 1 tablet (20 mg total) by mouth daily at 6 PM. 90 tablet 0   No facility-administered medications prior to visit.    PAST MEDICAL HISTORY: Past Medical History  Diagnosis Date  . Endometrial polyp   . Anal fissure 2006  . Elevated cholesterol   . Sleep disturbance   . Headache     PAST SURGICAL HISTORY: Past Surgical History  Procedure Laterality Date  . Wrist surgery    . Doppler echocardiography  04/27/2008    EF =>55%;lv fx normal  . Dilation and curettage of uterus  2009    endometrial polyp    FAMILY HISTORY: Family History  Problem Relation Age of Onset  . Diabetes Mother   . Cancer Mother     PANCREATIC  . Hypertension Father   . Heart disease Father   . Hypertension Sister   . Diabetes Paternal Grandmother   . Diabetes Paternal Grandfather   . Hypertension Paternal Grandfather   . Heart disease Paternal Grandfather   . Diabetes Brother     SOCIAL HISTORY: Social History   Social History  . Marital Status: Married    Spouse Name: N/A  . Number of Children: N/A  . Years of Education: N/A   Occupational History  . Not on file.   Social History Main Topics  . Smoking status: Former Research scientist (life sciences)  . Smokeless tobacco: Never Used  . Alcohol Use: Yes     Comment: Rare  . Drug Use: No  . Sexual Activity: Yes    Birth Control/ Protection: Post-menopausal     Comment:  Vasectomy   Other Topics Concern  . Not on file   Social History Narrative   Caffeine 1 cup coffee daily.      PHYSICAL EXAM  Filed Vitals:   02/28/16 1106  BP: 151/88  Pulse: 70  Height: 5\' 3"  (1.6 m)   There is no weight on file to calculate BMI. Generalized: Well developed, in no acute distress  Neurological examination   Mentation: Alert oriented to time, place, history taking. Follows all commands speech and language fluent Cranial nerve  Pupils were equal round reactive to light .extraocular movements were full, visual field were full  on confrontational test. Facial sensation and strength were normal.  Hearing was intact to finger rubbing bilaterally. Uvula tongue midline. head turning and shoulder shrug and were normal and symmetric.Tongue protrusion into cheek strength was normal. Motor: normal bulk and tone, full strength in the BUE, BLE, fine finger movements normal, no pronator drift. No focal weakness Coordination: finger-nose-finger, heel-to-shin bilaterally, no dysmetria Reflexes: 2/2, plantar responses were flexor bilaterally. Gait and Station: Rising up from seated position without assistance, normal stance, moderate stride, good arm swing, smooth turning, able to perform tiptoe, and heel walking without difficulty. Tandem gait normal   DIAGNOSTIC DATA (LABS, IMAGING, TESTING) - I reviewed patient records, labs, notes, testing and imaging myself where available.  Lab Results  Component Value Date   WBC 5.9 10/03/2015   HGB 14.0 10/03/2015   HCT 42.8 10/03/2015   MCV 90.5 10/03/2015   PLT 215 10/03/2015      Component Value Date/Time   NA 139 10/03/2015 0817   K 4.3 10/03/2015 0817   CL 107 10/03/2015 0817   CO2 25 10/03/2015 0817   GLUCOSE 95 10/03/2015 0817   BUN 23 10/03/2015 0817   CREATININE 0.80 10/03/2015 0817   CALCIUM 9.6 10/03/2015 0817   PROT 6.7 10/03/2015 0817   ALBUMIN 4.5 10/03/2015 0817   AST 13 10/03/2015 0817   ALT 14 10/03/2015 0817   ALKPHOS 58 10/03/2015 0817   BILITOT 0.5 10/03/2015 0817   Lab Results  Component Value Date   CHOL 189 10/03/2015   HDL 74 10/03/2015   LDLCALC 100 10/03/2015   TRIG 74 10/03/2015   CHOLHDL 2.6 10/03/2015    Lab Results  Component Value Date   TSH 0.874 10/03/2015      ASSESSMENT AND PLAN  55 y.o. year old female  has a past medical history of Menstrual migraines and headaches non-intractable here for her yearly follow-up. Her headaches are in good control   Continue Topamax at current dose will refill Continue maxalt acutely  will refill Follow-up yearly and when necessary Call for increase in headaches Dennie Bible, [facility], [facility], [facility] [address], [place], [place] [address] [phone] Notices the client Are

## 2016-02-28 NOTE — Patient Instructions (Signed)
Continue Topamax at current dose will refill for 3 month Continue maxalt acutely will refill  F/U yearly

## 2016-10-07 ENCOUNTER — Encounter: Payer: Self-pay | Admitting: Women's Health

## 2016-10-07 ENCOUNTER — Ambulatory Visit (INDEPENDENT_AMBULATORY_CARE_PROVIDER_SITE_OTHER): Payer: PRIVATE HEALTH INSURANCE | Admitting: Women's Health

## 2016-10-07 VITALS — BP 128/80 | Ht 63.0 in | Wt 165.0 lb

## 2016-10-07 DIAGNOSIS — Z13220 Encounter for screening for lipoid disorders: Secondary | ICD-10-CM

## 2016-10-07 DIAGNOSIS — Z01419 Encounter for gynecological examination (general) (routine) without abnormal findings: Secondary | ICD-10-CM | POA: Diagnosis not present

## 2016-10-07 DIAGNOSIS — Z13820 Encounter for screening for osteoporosis: Secondary | ICD-10-CM

## 2016-10-07 LAB — CBC WITH DIFFERENTIAL/PLATELET
Basophils Absolute: 0 cells/uL (ref 0–200)
Basophils Relative: 0 %
EOS ABS: 168 {cells}/uL (ref 15–500)
Eosinophils Relative: 3 %
HEMATOCRIT: 42 % (ref 35.0–45.0)
Hemoglobin: 13.5 g/dL (ref 11.7–15.5)
LYMPHS PCT: 38 %
Lymphs Abs: 2128 cells/uL (ref 850–3900)
MCH: 29.3 pg (ref 27.0–33.0)
MCHC: 32.1 g/dL (ref 32.0–36.0)
MCV: 91.1 fL (ref 80.0–100.0)
MONO ABS: 448 {cells}/uL (ref 200–950)
MONOS PCT: 8 %
MPV: 11.9 fL (ref 7.5–12.5)
NEUTROS PCT: 51 %
Neutro Abs: 2856 cells/uL (ref 1500–7800)
Platelets: 206 10*3/uL (ref 140–400)
RBC: 4.61 MIL/uL (ref 3.80–5.10)
RDW: 13.6 % (ref 11.0–15.0)
WBC: 5.6 10*3/uL (ref 3.8–10.8)

## 2016-10-07 LAB — COMPREHENSIVE METABOLIC PANEL
ALT: 15 U/L (ref 6–29)
AST: 12 U/L (ref 10–35)
Albumin: 4.2 g/dL (ref 3.6–5.1)
Alkaline Phosphatase: 56 U/L (ref 33–130)
BUN: 16 mg/dL (ref 7–25)
CALCIUM: 9.7 mg/dL (ref 8.6–10.4)
CO2: 28 mmol/L (ref 20–31)
Chloride: 105 mmol/L (ref 98–110)
Creat: 0.81 mg/dL (ref 0.50–1.05)
GLUCOSE: 98 mg/dL (ref 65–99)
POTASSIUM: 4.5 mmol/L (ref 3.5–5.3)
Sodium: 141 mmol/L (ref 135–146)
Total Bilirubin: 0.4 mg/dL (ref 0.2–1.2)
Total Protein: 6.2 g/dL (ref 6.1–8.1)

## 2016-10-07 LAB — LIPID PANEL
CHOL/HDL RATIO: 2.2 ratio (ref <=5.0)
Cholesterol: 156 mg/dL (ref 125–200)
HDL: 70 mg/dL (ref >=46)
LDL CALC: 70 mg/dL (ref <130)
Triglycerides: 81 mg/dL (ref <150)
VLDL: 16 mg/dL (ref <30)

## 2016-10-07 NOTE — Patient Instructions (Signed)
Dr Collene Mares [phone]  Menopause is a normal process in which your reproductive ability comes to an end. This process happens gradually over a span of months to years, usually between the ages of 38 and 55. Menopause is complete when you have missed 12 consecutive menstrual periods. It is important to talk with your health care provider about some of the most common conditions that affect postmenopausal women, such as heart disease, cancer, and bone loss (osteoporosis). Adopting a healthy lifestyle and getting preventive care can help to promote your health and wellness. Those actions can also lower your chances of developing some of these common conditions. WHAT SHOULD I KNOW ABOUT MENOPAUSE? During menopause, you may experience a number of symptoms, such as:  Moderate-to-severe hot flashes.  Night sweats.  Decrease in sex drive.  Mood swings.  Headaches.  Tiredness.  Irritability.  Memory problems.  Insomnia. Choosing to treat or not to treat menopausal changes is an individual decision that you make with your health care provider. WHAT SHOULD I KNOW ABOUT HORMONE REPLACEMENT THERAPY AND SUPPLEMENTS? Hormone therapy products are effective for treating symptoms that are associated with menopause, such as hot flashes and night sweats. Hormone replacement carries certain risks, especially as you become older. If you are thinking about using estrogen or estrogen with progestin treatments, discuss the benefits and risks with your health care provider. WHAT SHOULD I KNOW ABOUT HEART DISEASE AND STROKE? Heart disease, heart attack, and stroke become more likely as you age. This may be due, in part, to the hormonal changes that your body experiences during menopause. These can affect how your body processes dietary fats, triglycerides, and cholesterol. Heart attack and stroke are both medical emergencies. There are many things that you can do to help prevent heart disease and stroke:  Have your  blood pressure checked at least every 1-2 years. High blood pressure causes heart disease and increases the risk of stroke.  If you are 51-53 years old, ask your health care provider if you should take aspirin to prevent a heart attack or a stroke.  Do not use any tobacco products, including cigarettes, chewing tobacco, or electronic cigarettes. If you need help quitting, ask your health care provider.  It is important to eat a healthy diet and maintain a healthy weight.  Be sure to include plenty of vegetables, fruits, low-fat dairy products, and lean protein.  Avoid eating foods that are high in solid fats, added sugars, or salt (sodium).  Get regular exercise. This is one of the most important things that you can do for your health.  Try to exercise for at least 150 minutes each week. The type of exercise that you do should increase your heart rate and make you sweat. This is known as moderate-intensity exercise.  Try to do strengthening exercises at least twice each week. Do these in addition to the moderate-intensity exercise.  Know your numbers.Ask your health care provider to check your cholesterol and your blood glucose. Continue to have your blood tested as directed by your health care provider. WHAT SHOULD I KNOW ABOUT CANCER SCREENING? There are several types of cancer. Take the following steps to reduce your risk and to catch any cancer development as early as possible. Breast Cancer  Practice breast self-awareness.  This means understanding how your breasts normally appear and feel.  It also means doing regular breast self-exams. Let your health care provider know about any changes, no matter how small.  If you are 40 or older, have  a clinician do a breast exam (clinical breast exam or CBE) every year. Depending on your age, family history, and medical history, it may be recommended that you also have a yearly breast X-ray (mammogram).  If you have a family history of  breast cancer, talk with your health care provider about genetic screening.  If you are at high risk for breast cancer, talk with your health care provider about having an MRI and a mammogram every year.  Breast cancer (BRCA) gene test is recommended for women who have family members with BRCA-related cancers. Results of the assessment will determine the need for genetic counseling and BRCA1 and for BRCA2 testing. BRCA-related cancers include these types:  Breast. This occurs in males or females.  Ovarian.  Tubal. This may also be called fallopian tube cancer.  Cancer of the abdominal or pelvic lining (peritoneal cancer).  Prostate.  Pancreatic. Cervical, Uterine, and Ovarian Cancer Your health care provider may recommend that you be screened regularly for cancer of the pelvic organs. These include your ovaries, uterus, and vagina. This screening involves a pelvic exam, which includes checking for microscopic changes to the surface of your cervix (Pap test).  For women ages 21-65, health care providers may recommend a pelvic exam and a Pap test every three years. For women ages 43-65, they may recommend the Pap test and pelvic exam, combined with testing for human papilloma virus (HPV), every five years. Some types of HPV increase your risk of cervical cancer. Testing for HPV may also be done on women of any age who have unclear Pap test results.  Other health care providers may not recommend any screening for nonpregnant women who are considered low risk for pelvic cancer and have no symptoms. Ask your health care provider if a screening pelvic exam is right for you.  If you have had past treatment for cervical cancer or a condition that could lead to cancer, you need Pap tests and screening for cancer for at least 20 years after your treatment. If Pap tests have been discontinued for you, your risk factors (such as having a new sexual partner) need to be reassessed to determine if you  should start having screenings again. Some women have medical problems that increase the chance of getting cervical cancer. In these cases, your health care provider may recommend that you have screening and Pap tests more often.  If you have a family history of uterine cancer or ovarian cancer, talk with your health care provider about genetic screening.  If you have vaginal bleeding after reaching menopause, tell your health care provider.  There are currently no reliable tests available to screen for ovarian cancer. Lung Cancer Lung cancer screening is recommended for adults 69-83 years old who are at high risk for lung cancer because of a history of smoking. A yearly low-dose CT scan of the lungs is recommended if you:  Currently smoke.  Have a history of at least 30 pack-years of smoking and you currently smoke or have quit within the past 15 years. A pack-year is smoking an average of one pack of cigarettes per day for one year. Yearly screening should:  Continue until it has been 15 years since you quit.  Stop if you develop a health problem that would prevent you from having lung cancer treatment. Colorectal Cancer  This type of cancer can be detected and can often be prevented.  Routine colorectal cancer screening usually begins at age 15 and continues through age 47.  If you have risk factors for colon cancer, your health care provider may recommend that you be screened at an earlier age.  If you have a family history of colorectal cancer, talk with your health care provider about genetic screening.  Your health care provider may also recommend using home test kits to check for hidden blood in your stool.  A small camera at the end of a tube can be used to examine your colon directly (sigmoidoscopy or colonoscopy). This is done to check for the earliest forms of colorectal cancer.  Direct examination of the colon should be repeated every 5-10 years until age 67. However, if  early forms of precancerous polyps or small growths are found or if you have a family history or genetic risk for colorectal cancer, you may need to be screened more often. Skin Cancer  Check your skin from head to toe regularly.  Monitor any moles. Be sure to tell your health care provider:  About any new moles or changes in moles, especially if there is a change in a mole's shape or color.  If you have a mole that is larger than the size of a pencil eraser.  If any of your family members has a history of skin cancer, especially at a young age, talk with your health care provider about genetic screening.  Always use sunscreen. Apply sunscreen liberally and repeatedly throughout the day.  Whenever you are outside, protect yourself by wearing long sleeves, pants, a wide-brimmed hat, and sunglasses. WHAT SHOULD I KNOW ABOUT OSTEOPOROSIS? Osteoporosis is a condition in which bone destruction happens more quickly than new bone creation. After menopause, you may be at an increased risk for osteoporosis. To help prevent osteoporosis or the bone fractures that can happen because of osteoporosis, the following is recommended:  If you are 54-66 years old, get at least 1,000 mg of calcium and at least 600 mg of vitamin D per day.  If you are older than age 19 but younger than age 74, get at least 1,200 mg of calcium and at least 600 mg of vitamin D per day.  If you are older than age 19, get at least 1,200 mg of calcium and at least 800 mg of vitamin D per day. Smoking and excessive alcohol intake increase the risk of osteoporosis. Eat foods that are rich in calcium and vitamin D, and do weight-bearing exercises several times each week as directed by your health care provider. WHAT SHOULD I KNOW ABOUT HOW MENOPAUSE AFFECTS [place]? Depression may occur at any age, but it is more common as you become older. Common symptoms of depression include:  Low or sad mood.  Changes in sleep  patterns.  Changes in appetite or eating patterns.  Feeling an overall lack of motivation or enjoyment of activities that you previously enjoyed.  Frequent crying spells. Talk with your health care provider if you think that you are experiencing depression. WHAT SHOULD I KNOW ABOUT IMMUNIZATIONS? It is important that you get and maintain your immunizations. These include:  Tetanus, diphtheria, and pertussis (Tdap) booster vaccine.  Influenza every year before the flu season begins.  Pneumonia vaccine.  Shingles vaccine. Your health care provider may also recommend other immunizations.   This information is not intended to replace advice given to you by your health care provider. Make sure you discuss any questions you have with your health care provider.   Document Released: 01/23/2006 Document Revised: 12/22/2014 Document Reviewed: 08/03/2014 Elsevier Interactive Patient  Education 2016 [employer].

## 2016-10-07 NOTE — Progress Notes (Signed)
Julie Lucero 18-Nov-1961 [id number]    History:    Presents for annual exam.  Postmenopausal on no HRT with no bleeding. Normal Pap and mammogram history. 2007 negative colonoscopy. Overdue for mammogram. Has not had a DEXA. Hypercholesteremia on Crestor per primary care, migraines on Toprol per neurologist.  Past medical history, past surgical history, family history and social history were all reviewed and documented in the EPIC chart. 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 Mother died of pancreatic cancer,, father died of heart disease in his 62s.  ROS:  A ROS was performed and pertinent positives and negatives are included.  Exam:  Vitals:   10/07/16 0828  BP: 128/80  Weight: 165 lb (74.8 kg)  Height: 5\' 3"  (1.6 m)   Body mass index is 29.23 kg/m.   General appearance:  Normal Thyroid:  Symmetrical, normal in size, without palpable masses or nodularity. Respiratory  Auscultation:  Clear without wheezing or rhonchi Cardiovascular  Auscultation:  Regular rate, without rubs, murmurs or gallops  Edema/varicosities:  Not grossly evident Abdominal  Soft,nontender, without masses, guarding or rebound.  Liver/spleen:  No organomegaly noted  Hernia:  None appreciated  Skin  Inspection:  Grossly normal   Breasts: Examined lying and sitting.     Right: Without masses, retractions, discharge or axillary adenopathy.     Left: Without masses, retractions, discharge or axillary adenopathy. Gentitourinary   Inguinal/mons:  Normal without inguinal adenopathy  External genitalia:  Normal  BUS/Urethra/Skene's glands:  Normal  Vagina:  Normal  Cervix:  Normal  Uterus:   normal in size, shape and contour.  Midline and mobile  Adnexa/parametria:     Rt: Without masses or tenderness.   Lt: Without masses or tenderness.  Anus and perineum: Normal  Digital rectal exam: Normal sphincter tone without palpated masses or tenderness  Assessment/Plan:  55 y.o. MWF G2 P2 for annual exam with no  complaints.  Postmenopausal/no HRT/no bleeding Hypercholesteremia-primary care manages meds History of migraines neurologist manages  Plan: SBE's, reviewed importance of annual screening 3-D mammogram instructed to schedule. DEXA, will schedule at the breast center, home safety, fall prevention and importance of regular weightbearing exercise reviewed. Instructed to schedule follow-up screening colonoscopy with Dr. Collene Mares. CBC, lipid panel, vitamin D, CMP, UA, Pap 2015 normal with negative HR HPV typing, new screening guidelines reviewed.   Huel Cote [facility] 10:25 AM 10/07/2016

## 2016-10-08 ENCOUNTER — Encounter: Payer: Self-pay | Admitting: Women's Health

## 2016-10-08 LAB — URINALYSIS W MICROSCOPIC + REFLEX CULTURE
Bacteria, UA: NONE SEEN [HPF]
Bilirubin Urine: NEGATIVE
Casts: NONE SEEN [LPF]
Crystals: NONE SEEN [HPF]
GLUCOSE, UA: NEGATIVE
HGB URINE DIPSTICK: NEGATIVE
Ketones, ur: NEGATIVE
LEUKOCYTES UA: NEGATIVE
NITRITE: NEGATIVE
PH: 6.5 (ref 5.0–8.0)
Protein, ur: NEGATIVE
RBC / HPF: NONE SEEN RBC/HPF (ref <=2)
SPECIFIC GRAVITY, URINE: 1.022 (ref 1.001–1.035)
YEAST: NONE SEEN [HPF]

## 2016-10-08 LAB — VITAMIN D 25 HYDROXY (VIT D DEFICIENCY, FRACTURES): VIT D 25 HYDROXY: 39 ng/mL (ref 30–100)

## 2016-10-09 LAB — URINE CULTURE: Organism ID, Bacteria: NO GROWTH

## 2016-11-07 ENCOUNTER — Other Ambulatory Visit: Payer: Self-pay | Admitting: Neurology

## 2016-11-14 ENCOUNTER — Ambulatory Visit
Admission: RE | Admit: 2016-11-14 | Discharge: 2016-11-14 | Disposition: A | Discharge status: Home / Self Care | Payer: PRIVATE HEALTH INSURANCE | Source: Ambulatory Visit | Attending: Gynecology | Admitting: Gynecology

## 2016-11-14 ENCOUNTER — Other Ambulatory Visit: Payer: Self-pay | Admitting: Gynecology

## 2016-11-14 DIAGNOSIS — Z1231 Encounter for screening mammogram for malignant neoplasm of breast: Secondary | ICD-10-CM

## 2017-02-26 ENCOUNTER — Ambulatory Visit: Payer: PRIVATE HEALTH INSURANCE | Admitting: Nurse Practitioner

## 2017-02-27 ENCOUNTER — Encounter: Payer: Self-pay | Admitting: Nurse Practitioner

## 2017-08-21 ENCOUNTER — Telehealth: Payer: Self-pay | Admitting: *Deleted

## 2017-08-21 DIAGNOSIS — Z13220 Encounter for screening for lipoid disorders: Secondary | ICD-10-CM

## 2017-08-21 DIAGNOSIS — Z01419 Encounter for gynecological examination (general) (routine) without abnormal findings: Secondary | ICD-10-CM

## 2017-08-21 NOTE — Telephone Encounter (Signed)
Pt informed with the below note, is going to check with her insurance regarding this. I told her I will placed the orders for labs.

## 2017-08-21 NOTE — Telephone Encounter (Signed)
Patient has annual scheduled on 10/12/17, pt wants to have labs done on 09/07/17 due to health assessment. Please advise

## 2017-08-21 NOTE — Telephone Encounter (Signed)
Labs were done on 10/19 last year, sometimes won't be paid for if < 1 year. Let her know lab is open 8:30- 4:45. Closed 1-2.  [place] for :lipid panel, CMP, CBC and if anything else she needs for screening.

## 2017-09-07 ENCOUNTER — Other Ambulatory Visit: Payer: PRIVATE HEALTH INSURANCE

## 2017-09-14 ENCOUNTER — Other Ambulatory Visit: Payer: PRIVATE HEALTH INSURANCE

## 2017-09-14 DIAGNOSIS — Z01419 Encounter for gynecological examination (general) (routine) without abnormal findings: Secondary | ICD-10-CM

## 2017-09-14 DIAGNOSIS — Z13220 Encounter for screening for lipoid disorders: Secondary | ICD-10-CM

## 2017-09-14 LAB — LIPID PANEL
Cholesterol: 162 mg/dL (ref <200)
HDL: 63 mg/dL (ref >50)
LDL Cholesterol (Calc): 85 mg/dL (calc)
Non-HDL Cholesterol (Calc): 99 mg/dL (calc) (ref <130)
Total CHOL/HDL Ratio: 2.6 (calc) (ref <5.0)
Triglycerides: 67 mg/dL (ref <150)

## 2017-09-14 LAB — COMPREHENSIVE METABOLIC PANEL
AG RATIO: 2 (calc) (ref 1.0–2.5)
ALT: 16 U/L (ref 6–29)
AST: 15 U/L (ref 10–35)
Albumin: 4.3 g/dL (ref 3.6–5.1)
Alkaline phosphatase (APISO): 58 U/L (ref 33–130)
BUN: 20 mg/dL (ref 7–25)
CHLORIDE: 104 mmol/L (ref 98–110)
CO2: 30 mmol/L (ref 20–32)
Calcium: 9.7 mg/dL (ref 8.6–10.4)
Creat: 0.65 mg/dL (ref 0.50–1.05)
GLOBULIN: 2.1 g/dL (ref 1.9–3.7)
GLUCOSE: 88 mg/dL (ref 65–99)
POTASSIUM: 4.2 mmol/L (ref 3.5–5.3)
SODIUM: 139 mmol/L (ref 135–146)
TOTAL PROTEIN: 6.4 g/dL (ref 6.1–8.1)
Total Bilirubin: 0.5 mg/dL (ref 0.2–1.2)

## 2017-09-14 LAB — CBC WITH DIFFERENTIAL/PLATELET
Basophils Absolute: 58 cells/uL (ref 0–200)
Basophils Relative: 0.9 %
Eosinophils Absolute: 224 cells/uL (ref 15–500)
Eosinophils Relative: 3.5 %
HCT: 40.7 % (ref 35.0–45.0)
Hemoglobin: 13.5 g/dL (ref 11.7–15.5)
Lymphs Abs: 1952 cells/uL (ref 850–3900)
MCH: 29.2 pg (ref 27.0–33.0)
MCHC: 33.2 g/dL (ref 32.0–36.0)
MCV: 87.9 fL (ref 80.0–100.0)
MPV: 12.9 fL — ABNORMAL HIGH (ref 7.5–12.5)
Monocytes Relative: 8 %
Neutro Abs: 3654 cells/uL (ref 1500–7800)
Neutrophils Relative %: 57.1 %
Platelets: 185 10*3/uL (ref 140–400)
RBC: 4.63 10*6/uL (ref 3.80–5.10)
RDW: 12.8 % (ref 11.0–15.0)
Total Lymphocyte: 30.5 %
WBC mixed population: 512 cells/uL (ref 200–950)
WBC: 6.4 10*3/uL (ref 3.8–10.8)

## 2017-10-12 ENCOUNTER — Encounter: Payer: Self-pay | Admitting: Women's Health

## 2017-10-12 ENCOUNTER — Ambulatory Visit (INDEPENDENT_AMBULATORY_CARE_PROVIDER_SITE_OTHER): Payer: PRIVATE HEALTH INSURANCE | Admitting: Women's Health

## 2017-10-12 VITALS — BP 130/80 | Ht 63.0 in | Wt 168.0 lb

## 2017-10-12 DIAGNOSIS — Z01419 Encounter for gynecological examination (general) (routine) without abnormal findings: Secondary | ICD-10-CM

## 2017-10-12 DIAGNOSIS — Z13820 Encounter for screening for osteoporosis: Secondary | ICD-10-CM | POA: Diagnosis not present

## 2017-10-12 NOTE — Patient Instructions (Signed)
Health Maintenance for Postmenopausal Women Menopause is a normal process in which your reproductive ability comes to an end. This process happens gradually over a span of months to years, usually between the ages of 22 and 9. Menopause is complete when you have missed 12 consecutive menstrual periods. It is important to talk with your health care provider about some of the most common conditions that affect postmenopausal women, such as heart disease, cancer, and bone loss (osteoporosis). Adopting a healthy lifestyle and getting preventive care can help to promote your health and wellness. Those actions can also lower your chances of developing some of these common conditions. What should I know about menopause? During menopause, you may experience a number of symptoms, such as:  Moderate-to-severe hot flashes.  Night sweats.  Decrease in sex drive.  Mood swings.  Headaches.  Tiredness.  Irritability.  Memory problems.  Insomnia.  Choosing to treat or not to treat menopausal changes is an individual decision that you make with your health care provider. What should I know about hormone replacement therapy and supplements? Hormone therapy products are effective for treating symptoms that are associated with menopause, such as hot flashes and night sweats. Hormone replacement carries certain risks, especially as you become older. If you are thinking about using estrogen or estrogen with progestin treatments, discuss the benefits and risks with your health care provider. What should I know about heart disease and stroke? Heart disease, heart attack, and stroke become more likely as you age. This may be due, in part, to the hormonal changes that your body experiences during menopause. These can affect how your body processes dietary fats, triglycerides, and cholesterol. Heart attack and stroke are both medical emergencies. There are many things that you can do to help prevent heart disease  and stroke:  Have your blood pressure checked at least every 1-2 years. High blood pressure causes heart disease and increases the risk of stroke.  If you are 53-22 years old, ask your health care provider if you should take aspirin to prevent a heart attack or a stroke.  Do not use any tobacco products, including cigarettes, chewing tobacco, or electronic cigarettes. If you need help quitting, ask your health care provider.  It is important to eat a healthy diet and maintain a healthy weight. ? Be sure to include plenty of vegetables, fruits, low-fat dairy products, and lean protein. ? Avoid eating foods that are high in solid fats, added sugars, or salt (sodium).  Get regular exercise. This is one of the most important things that you can do for your health. ? Try to exercise for at least 150 minutes each week. The type of exercise that you do should increase your heart rate and make you sweat. This is known as moderate-intensity exercise. ? Try to do strengthening exercises at least twice each week. Do these in addition to the moderate-intensity exercise.  Know your numbers.Ask your health care provider to check your cholesterol and your blood glucose. Continue to have your blood tested as directed by your health care provider.  What should I know about cancer screening? There are several types of cancer. Take the following steps to reduce your risk and to catch any cancer development as early as possible. Breast Cancer  Practice breast self-awareness. ? This means understanding how your breasts normally appear and feel. ? It also means doing regular breast self-exams. Let your health care provider know about any changes, no matter how small.  If you are 40  or older, have a clinician do a breast exam (clinical breast exam or CBE) every year. Depending on your age, family history, and medical history, it may be recommended that you also have a yearly breast X-ray (mammogram).  If you  have a family history of breast cancer, talk with your health care provider about genetic screening.  If you are at high risk for breast cancer, talk with your health care provider about having an MRI and a mammogram every year.  Breast cancer (BRCA) gene test is recommended for women who have family members with BRCA-related cancers. Results of the assessment will determine the need for genetic counseling and BRCA1 and for BRCA2 testing. BRCA-related cancers include these types: ? Breast. This occurs in males or females. ? Ovarian. ? Tubal. This may also be called fallopian tube cancer. ? Cancer of the abdominal or pelvic lining (peritoneal cancer). ? Prostate. ? Pancreatic.  Cervical, Uterine, and Ovarian Cancer Your health care provider may recommend that you be screened regularly for cancer of the pelvic organs. These include your ovaries, uterus, and vagina. This screening involves a pelvic exam, which includes checking for microscopic changes to the surface of your cervix (Pap test).  For women ages 21-65, health care providers may recommend a pelvic exam and a Pap test every three years. For women ages 79-65, they may recommend the Pap test and pelvic exam, combined with testing for human papilloma virus (HPV), every five years. Some types of HPV increase your risk of cervical cancer. Testing for HPV may also be done on women of any age who have unclear Pap test results.  Other health care providers may not recommend any screening for nonpregnant women who are considered low risk for pelvic cancer and have no symptoms. Ask your health care provider if a screening pelvic exam is right for you.  If you have had past treatment for cervical cancer or a condition that could lead to cancer, you need Pap tests and screening for cancer for at least 20 years after your treatment. If Pap tests have been discontinued for you, your risk factors (such as having a new sexual partner) need to be  reassessed to determine if you should start having screenings again. Some women have medical problems that increase the chance of getting cervical cancer. In these cases, your health care provider may recommend that you have screening and Pap tests more often.  If you have a family history of uterine cancer or ovarian cancer, talk with your health care provider about genetic screening.  If you have vaginal bleeding after reaching menopause, tell your health care provider.  There are currently no reliable tests available to screen for ovarian cancer.  Lung Cancer Lung cancer screening is recommended for adults 69-62 years old who are at high risk for lung cancer because of a history of smoking. A yearly low-dose CT scan of the lungs is recommended if you:  Currently smoke.  Have a history of at least 30 pack-years of smoking and you currently smoke or have quit within the past 15 years. A pack-year is smoking an average of one pack of cigarettes per day for one year.  Yearly screening should:  Continue until it has been 15 years since you quit.  Stop if you develop a health problem that would prevent you from having lung cancer treatment.  Colorectal Cancer  This type of cancer can be detected and can often be prevented.  Routine colorectal cancer screening usually begins at  age 42 and continues through age 45.  If you have risk factors for colon cancer, your health care provider may recommend that you be screened at an earlier age.  If you have a family history of colorectal cancer, talk with your health care provider about genetic screening.  Your health care provider may also recommend using home test kits to check for hidden blood in your stool.  A small camera at the end of a tube can be used to examine your colon directly (sigmoidoscopy or colonoscopy). This is done to check for the earliest forms of colorectal cancer.  Direct examination of the colon should be repeated every  5-10 years until age 71. However, if early forms of precancerous polyps or small growths are found or if you have a family history or genetic risk for colorectal cancer, you may need to be screened more often.  Skin Cancer  Check your skin from head to toe regularly.  Monitor any moles. Be sure to tell your health care provider: ? About any new moles or changes in moles, especially if there is a change in a mole's shape or color. ? If you have a mole that is larger than the size of a pencil eraser.  If any of your family members has a history of skin cancer, especially at a Shaquira Moroz age, talk with your health care provider about genetic screening.  Always use sunscreen. Apply sunscreen liberally and repeatedly throughout the day.  Whenever you are outside, protect yourself by wearing long sleeves, pants, a wide-brimmed hat, and sunglasses.  What should I know about osteoporosis? Osteoporosis is a condition in which bone destruction happens more quickly than new bone creation. After menopause, you may be at an increased risk for osteoporosis. To help prevent osteoporosis or the bone fractures that can happen because of osteoporosis, the following is recommended:  If you are 46-71 years old, get at least 1,000 mg of calcium and at least 600 mg of vitamin D per day.  If you are older than age 55 but younger than age 65, get at least 1,200 mg of calcium and at least 600 mg of vitamin D per day.  If you are older than age 54, get at least 1,200 mg of calcium and at least 800 mg of vitamin D per day.  Smoking and excessive alcohol intake increase the risk of osteoporosis. Eat foods that are rich in calcium and vitamin D, and do weight-bearing exercises several times each week as directed by your health care provider. What should I know about how menopause affects my mental health? Depression may occur at any age, but it is more common as you become older. Common symptoms of depression  include:  Low or sad mood.  Changes in sleep patterns.  Changes in appetite or eating patterns.  Feeling an overall lack of motivation or enjoyment of activities that you previously enjoyed.  Frequent crying spells.  Talk with your health care provider if you think that you are experiencing depression. What should I know about immunizations? It is important that you get and maintain your immunizations. These include:  Tetanus, diphtheria, and pertussis (Tdap) booster vaccine.  Influenza every year before the flu season begins.  Pneumonia vaccine.  Shingles vaccine.  Your health care provider may also recommend other immunizations. This information is not intended to replace advice given to you by your health care provider. Make sure you discuss any questions you have with your health care provider. Document Released: 01/23/2006  Document Revised: 06/20/2016 Document Reviewed: 09/04/2015 Elsevier Interactive Patient Education  2018 [employer]  

## 2017-10-12 NOTE — Progress Notes (Signed)
Julie Lucero 25-May-1961 [id number]    History:    Presents for annual exam.  Postmenopausal on no HRT with no bleeding. Normal Pap and mammogram history. History of migraines now rare. 2007 negative colonoscopy. Has not had a screening DEXA. Hypercholesterolemia managed by primary care. History of rectal fissure.   Past medical history, past surgical history, family history and social history were all reviewed and documented in the EPIC chart. Works at [employer]. Both children doing well. Mother died of pancreatic cancer, father died of heart disease in his 15s.  ROS:  A ROS was performed and pertinent positives and negatives are included.  Exam:  Vitals:   10/12/17 0858  BP: 130/80  Weight: 168 lb (76.2 kg)  Height: 5\' 3"  (1.6 m)   Body mass index is 29.76 kg/m.   General appearance:  Normal Thyroid:  Symmetrical, normal in size, without palpable masses or nodularity. Respiratory  Auscultation:  Clear without wheezing or rhonchi Cardiovascular  Auscultation:  Regular rate, without rubs, murmurs or gallops  Edema/varicosities:  Not grossly evident Abdominal  Soft,nontender, without masses, guarding or rebound.  Liver/spleen:  No organomegaly noted  Hernia:  None appreciated  Skin  Inspection:  Grossly normal   Breasts: Examined lying and sitting.     Right: Without masses, retractions, discharge or axillary adenopathy.     Left: Without masses, retractions, discharge or axillary adenopathy. Gentitourinary   Inguinal/mons:  Normal without inguinal adenopathy  External genitalia:  Normal  BUS/Urethra/Skene's glands:  Normal  Vagina:  Normal  Cervix:  Normal  Uterus:  normal in size, shape and contour.  Midline and mobile  Adnexa/parametria:     Rt: Without masses or tenderness.   Lt: Without masses or tenderness.  Anus and perineum: Normal  Digital rectal exam: Normal sphincter tone without palpated masses or tenderness  Assessment/Plan:  56 y.o. M WF G2 P2  for annual exam with no complaints.  Postmenopausal/no HRT/no bleeding Hypercholesterolemia-primary care manages labs and meds  Plan: SBE's, continue annual screening mammogram, calcium rich diet, vitamin D 2000 daily encouraged. Reviewed importance of regular exercise, home safety, fall prevention reviewed. Instructed to schedule screening DEXA will do at work. Screening colonoscopy instructed to schedule - has appointment with Dr. Collene Mares. Pap with HR HPV typing, new screening guidelines reviewed.    [place], 11:15 AM 10/12/2017

## 2017-10-13 LAB — URINALYSIS W MICROSCOPIC + REFLEX CULTURE
BACTERIA UA: NONE SEEN /HPF
Bilirubin Urine: NEGATIVE
Glucose, UA: NEGATIVE
HGB URINE DIPSTICK: NEGATIVE
KETONES UR: NEGATIVE
LEUKOCYTE ESTERASE: NEGATIVE
Nitrites, Initial: NEGATIVE
Protein, ur: NEGATIVE
RBC / HPF: NONE SEEN /HPF (ref 0–2)
Specific Gravity, Urine: 1.023 (ref 1.001–1.03)
pH: <=5 (ref 5.0–8.0)

## 2017-10-13 LAB — NO CULTURE INDICATED

## 2017-10-14 LAB — PAP, TP IMAGING W/ HPV RNA, RFLX HPV TYPE 16,18/45: HPV DNA HIGH RISK: NOT DETECTED

## 2017-11-02 ENCOUNTER — Encounter: Payer: Self-pay | Admitting: Women's Health

## 2018-03-04 ENCOUNTER — Ambulatory Visit (INDEPENDENT_AMBULATORY_CARE_PROVIDER_SITE_OTHER): Payer: PRIVATE HEALTH INSURANCE | Admitting: Gynecology

## 2018-03-04 ENCOUNTER — Encounter: Payer: Self-pay | Admitting: Gynecology

## 2018-03-04 VITALS — BP 118/76

## 2018-03-04 DIAGNOSIS — M545 Low back pain, unspecified: Secondary | ICD-10-CM

## 2018-03-04 DIAGNOSIS — N951 Menopausal and female climacteric states: Secondary | ICD-10-CM

## 2018-03-04 MED ORDER — VENLAFAXINE HCL ER 37.5 MG PO CP24: 37.5000 mg | ORAL_CAPSULE | Freq: Every day | ORAL | 8 refills | Status: DC

## 2018-03-04 NOTE — Progress Notes (Signed)
    Julie Lucero 1961/06/04 [id number]        57 y.o.  G2P2002 presents with 2 issues:  1. Low back pain starting over the last 24-48 hours.  The patient self medicated herself 2 weeks ago for a UTI using her daughter's antibiotic prescription.  She was having frequency, mild dysuria and urgency.  No low back pain fever or chills.  She notes that the symptoms all resolved after taking Macrodantin.  Over the last day or 2 she has had the onset of low back pain aching pressure symptoms.  Has been using heat on and off and took 1 dose of Motrin.  No precipitating events to cause muscle strain or back discomfort.  No radiation or sciatica type symptoms.  No pelvic/abdominal symptoms.  No vaginal bleeding or pelvic cramping.  No nausea vomiting diarrhea constipation. 2. Menopausal symptoms to include hot flushes and sweats.  Has been putting up with these for years.  Has tried OTC products without success and asked my opinion about Effexor having done reading.  No significant weight loss or gain.  No skin or hair changes.    Past medical history,surgical history, problem list, medications, allergies, family history and social history were all reviewed and documented in the EPIC chart.  Directed ROS with pertinent positives and negatives documented in the history of present illness/assessment and plan.  Exam: Vitals:   03/04/18 1432  BP: 118/76   General appearance:  Normal Spine straight without CVA tenderness or evidence of muscle spasm. Abdomen soft nontender without masses guarding rebound  Assessment/Plan:  57 y.o. [id number] with:  1. Low back pain of 2-day duration.  No significant findings on physical exam.  Recommended continuing heat and taking ibuprofen on a more consistent basis with 400 mg every 6 hours.  Follow over the next several days.  Follow-up if symptoms persist for more involved evaluation.  If symptoms totally resolved and she will follow expectantly. 2. Menopausal symptoms.  I  discussed options to include OTC products, HRT and non-hormonal pharmacologic such as Effexor.  Side effects/risks of various options reviewed.  Patient would like trial of Effexor.  She would like to try to avoid hormones if possible.  I reviewed side effect profile of Effexor up to and including worsening depression and suicidal ideation.  Will start low with 37.5 mg E she will assess her symptoms at 1 month and call me if she does not feel adequate response and will go to 75 mg.  Will check baseline thyroid to rule out thyroid dysfunction as etiology for her symptoms.  Greater than 50% of my time was spent in direct face to face counseling and coordination of care with the patient.     Anastasio Auerbach MD, 3:15 PM 03/04/2018

## 2018-03-05 LAB — URINALYSIS, COMPLETE W/RFL CULTURE
BACTERIA UA: NONE SEEN /HPF
Bilirubin Urine: NEGATIVE
Glucose, UA: NEGATIVE
HGB URINE DIPSTICK: NEGATIVE
HYALINE CAST: NONE SEEN /LPF
KETONES UR: NEGATIVE
Leukocyte Esterase: NEGATIVE
Nitrites, Initial: NEGATIVE
Protein, ur: NEGATIVE
RBC / HPF: NONE SEEN /HPF (ref 0–2)
Specific Gravity, Urine: 1.02 (ref 1.001–1.03)
WBC UA: NONE SEEN /HPF (ref 0–5)
pH: 6.5 (ref 5.0–8.0)

## 2018-03-05 LAB — TSH: TSH: 1.42 m[IU]/L (ref 0.40–4.50)

## 2018-03-05 LAB — NO CULTURE INDICATED

## 2018-05-26 ENCOUNTER — Telehealth: Payer: Self-pay | Admitting: *Deleted

## 2018-05-26 MED ORDER — VENLAFAXINE HCL ER 75 MG PO CP24: 75.0000 mg | ORAL_CAPSULE | Freq: Every day | ORAL | 4 refills | Status: DC

## 2018-05-26 NOTE — Telephone Encounter (Signed)
Patient called takes Effexor-XR 37.5 mg tablet daily, called asking if increase can be sent to pharmacy, states she is still having hot flashes and would like to have no hot flashes. Please advise

## 2018-05-26 NOTE — Telephone Encounter (Signed)
Okay to increase to 75 mg XR

## 2018-05-26 NOTE — Telephone Encounter (Signed)
Rx sent, pt aware 

## 2018-09-06 DIAGNOSIS — I10 Essential (primary) hypertension: Secondary | ICD-10-CM | POA: Insufficient documentation

## 2018-10-06 DIAGNOSIS — R5383 Other fatigue: Secondary | ICD-10-CM | POA: Insufficient documentation

## 2018-10-06 DIAGNOSIS — E669 Obesity, unspecified: Secondary | ICD-10-CM | POA: Insufficient documentation

## 2018-10-06 DIAGNOSIS — Z23 Encounter for immunization: Secondary | ICD-10-CM | POA: Insufficient documentation

## 2018-10-13 ENCOUNTER — Encounter: Payer: PRIVATE HEALTH INSURANCE | Admitting: Women's Health

## 2018-10-20 ENCOUNTER — Encounter: Payer: Self-pay | Admitting: Women's Health

## 2018-10-20 ENCOUNTER — Ambulatory Visit (INDEPENDENT_AMBULATORY_CARE_PROVIDER_SITE_OTHER): Payer: PRIVATE HEALTH INSURANCE | Admitting: Women's Health

## 2018-10-20 VITALS — BP 134/80 | Ht 63.0 in

## 2018-10-20 DIAGNOSIS — Z13820 Encounter for screening for osteoporosis: Secondary | ICD-10-CM | POA: Diagnosis not present

## 2018-10-20 DIAGNOSIS — Z01419 Encounter for gynecological examination (general) (routine) without abnormal findings: Secondary | ICD-10-CM | POA: Diagnosis not present

## 2018-10-20 NOTE — Progress Notes (Signed)
Julie Lucero 08-31-1961 [id number]    History:    Presents for annual exam.  Postmenopausal on no HRT with no bleeding.  Normal Pap and mammogram history.  Has not had a screening DEXA.  12/2017 benign colon polyp.  History of migraines with aura.  Primary care manages hypertension, hypercholesteremia and anxiety/depression.  Reports work is good but home life is stressful husband alcoholic.  Past medical history, past surgical history, family history and social history were all reviewed and documented in the EPIC chart.  Works at [employer].  Mother died of pancreatic cancer, father died from heart disease in his 43s.  ROS:  A ROS was performed and pertinent positives and negatives are included.  Exam:  Vitals:   10/20/18 0803  BP: 134/80  Height: 5\' 3"  (1.6 m)   Body mass index is 29.76 kg/m.   General appearance:  Normal Thyroid:  Symmetrical, normal in size, without palpable masses or nodularity. Respiratory  Auscultation:  Clear without wheezing or rhonchi Cardiovascular  Auscultation:  Regular rate, without rubs, murmurs or gallops  Edema/varicosities:  Not grossly evident Abdominal  Soft,nontender, without masses, guarding or rebound.  Liver/spleen:  No organomegaly noted  Hernia:  None appreciated  Skin  Inspection:  Grossly normal   Breasts: Examined lying and sitting.     Right: Without masses, retractions, discharge or axillary adenopathy.     Left: Without masses, retractions, discharge or axillary adenopathy. Gentitourinary   Inguinal/mons:  Normal without inguinal adenopathy  External genitalia:  Normal  BUS/Urethra/Skene's glands:  Normal  Vagina:  Normal  Cervix:  Normal  Uterus:   normal in size, shape and contour.  Midline and mobile  Adnexa/parametria:     Rt: Without masses or tenderness.   Lt: Without masses or tenderness.  Anus and perineum: Normal  Digital rectal exam: Normal sphincter tone without palpated masses or  tenderness  Assessment/Plan:  57 y.o. MWF G2 P2 for annual exam.   postmenopausal on no HRT with no bleeding History of migraines with aura - rare Hypertension, hypercholesteremia, anxiety/depression managed by primary care labs and meds Situational stress  Plan: SBE's, overdue for mammogram encouraged to schedule ASAP, calcium rich foods, vitamin D 2000 daily encouraged.  Encouraged increased regular weight bearing and balance type exercise.  DEXA, will schedule.  Shingrex reviewed will get at primary care.  Encouraged self-care, leisure activities, exercise and counseling, Marya Amsler at [facility] counseling number given instructed to schedule.  Pap normal 2018, new screening guidelines reviewed.   [place], 8:59 AM 10/20/2018

## 2018-10-20 NOTE — Patient Instructions (Signed)
Health Maintenance for Postmenopausal Women Menopause is a normal process in which your reproductive ability comes to an end. This process happens gradually over a span of months to years, usually between the ages of 97 and 53. Menopause is complete when you have missed 12 consecutive menstrual periods. It is important to talk with your health care provider about some of the most common conditions that affect postmenopausal women, such as heart disease, cancer, and bone loss (osteoporosis). Adopting a healthy lifestyle and getting preventive care can help to promote your health and wellness. Those actions can also lower your chances of developing some of these common conditions. What should I know about menopause? During menopause, you may experience a number of symptoms, such as:  Moderate-to-severe hot flashes.  Night sweats.  Decrease in sex drive.  Mood swings.  Headaches.  Tiredness.  Irritability.  Memory problems.  Insomnia.  Choosing to treat or not to treat menopausal changes is an individual decision that you make with your health care provider. What should I know about hormone replacement therapy and supplements? Hormone therapy products are effective for treating symptoms that are associated with menopause, such as hot flashes and night sweats. Hormone replacement carries certain risks, especially as you become older. If you are thinking about using estrogen or estrogen with progestin treatments, discuss the benefits and risks with your health care provider. What should I know about heart disease and stroke? Heart disease, heart attack, and stroke become more likely as you age. This may be due, in part, to the hormonal changes that your body experiences during menopause. These can affect how your body processes dietary fats, triglycerides, and cholesterol. Heart attack and stroke are both medical emergencies. There are many things that you can do to help prevent heart disease  and stroke:  Have your blood pressure checked at least every 1-2 years. High blood pressure causes heart disease and increases the risk of stroke.  If you are 34-61 years old, ask your health care provider if you should take aspirin to prevent a heart attack or a stroke.  Do not use any tobacco products, including cigarettes, chewing tobacco, or electronic cigarettes. If you need help quitting, ask your health care provider.  It is important to eat a healthy diet and maintain a healthy weight. ? Be sure to include plenty of vegetables, fruits, low-fat dairy products, and lean protein. ? Avoid eating foods that are high in solid fats, added sugars, or salt (sodium).  Get regular exercise. This is one of the most important things that you can do for your health. ? Try to exercise for at least 150 minutes each week. The type of exercise that you do should increase your heart rate and make you sweat. This is known as moderate-intensity exercise. ? Try to do strengthening exercises at least twice each week. Do these in addition to the moderate-intensity exercise.  Know your numbers.Ask your health care provider to check your cholesterol and your blood glucose. Continue to have your blood tested as directed by your health care provider.  What should I know about cancer screening? There are several types of cancer. Take the following steps to reduce your risk and to catch any cancer development as early as possible. Breast Cancer  Practice breast self-awareness. ? This means understanding how your breasts normally appear and feel. ? It also means doing regular breast self-exams. Let your health care provider know about any changes, no matter how small.  If you are 40  or older, have a clinician do a breast exam (clinical breast exam or CBE) every year. Depending on your age, family history, and medical history, it may be recommended that you also have a yearly breast X-ray (mammogram).  If you  have a family history of breast cancer, talk with your health care provider about genetic screening.  If you are at high risk for breast cancer, talk with your health care provider about having an MRI and a mammogram every year.  Breast cancer (BRCA) gene test is recommended for women who have family members with BRCA-related cancers. Results of the assessment will determine the need for genetic counseling and BRCA1 and for BRCA2 testing. BRCA-related cancers include these types: ? Breast. This occurs in males or females. ? Ovarian. ? Tubal. This may also be called fallopian tube cancer. ? Cancer of the abdominal or pelvic lining (peritoneal cancer). ? Prostate. ? Pancreatic.  Cervical, Uterine, and Ovarian Cancer Your health care provider may recommend that you be screened regularly for cancer of the pelvic organs. These include your ovaries, uterus, and vagina. This screening involves a pelvic exam, which includes checking for microscopic changes to the surface of your cervix (Pap test).  For women ages 21-65, health care providers may recommend a pelvic exam and a Pap test every three years. For women ages 87-65, they may recommend the Pap test and pelvic exam, combined with testing for human papilloma virus (HPV), every five years. Some types of HPV increase your risk of cervical cancer. Testing for HPV may also be done on women of any age who have unclear Pap test results.  Other health care providers may not recommend any screening for nonpregnant women who are considered low risk for pelvic cancer and have no symptoms. Ask your health care provider if a screening pelvic exam is right for you.  If you have had past treatment for cervical cancer or a condition that could lead to cancer, you need Pap tests and screening for cancer for at least 20 years after your treatment. If Pap tests have been discontinued for you, your risk factors (such as having a new sexual partner) need to be  reassessed to determine if you should start having screenings again. Some women have medical problems that increase the chance of getting cervical cancer. In these cases, your health care provider may recommend that you have screening and Pap tests more often.  If you have a family history of uterine cancer or ovarian cancer, talk with your health care provider about genetic screening.  If you have vaginal bleeding after reaching menopause, tell your health care provider.  There are currently no reliable tests available to screen for ovarian cancer.  Lung Cancer Lung cancer screening is recommended for adults 11-21 years old who are at high risk for lung cancer because of a history of smoking. A yearly low-dose CT scan of the lungs is recommended if you:  Currently smoke.  Have a history of at least 30 pack-years of smoking and you currently smoke or have quit within the past 15 years. A pack-year is smoking an average of one pack of cigarettes per day for one year.  Yearly screening should:  Continue until it has been 15 years since you quit.  Stop if you develop a health problem that would prevent you from having lung cancer treatment.  Colorectal Cancer  This type of cancer can be detected and can often be prevented.  Routine colorectal cancer screening usually begins at  age 79 and continues through age 2.  If you have risk factors for colon cancer, your health care provider may recommend that you be screened at an earlier age.  If you have a family history of colorectal cancer, talk with your health care provider about genetic screening.  Your health care provider may also recommend using home test kits to check for hidden blood in your stool.  A small camera at the end of a tube can be used to examine your colon directly (sigmoidoscopy or colonoscopy). This is done to check for the earliest forms of colorectal cancer.  Direct examination of the colon should be repeated every  5-10 years until age 21. However, if early forms of precancerous polyps or small growths are found or if you have a family history or genetic risk for colorectal cancer, you may need to be screened more often.  Skin Cancer  Check your skin from head to toe regularly.  Monitor any moles. Be sure to tell your health care provider: ? About any new moles or changes in moles, especially if there is a change in a mole's shape or color. ? If you have a mole that is larger than the size of a pencil eraser.  If any of your family members has a history of skin cancer, especially at a Libertie Hausler age, talk with your health care provider about genetic screening.  Always use sunscreen. Apply sunscreen liberally and repeatedly throughout the day.  Whenever you are outside, protect yourself by wearing long sleeves, pants, a wide-brimmed hat, and sunglasses.  What should I know about osteoporosis? Osteoporosis is a condition in which bone destruction happens more quickly than new bone creation. After menopause, you may be at an increased risk for osteoporosis. To help prevent osteoporosis or the bone fractures that can happen because of osteoporosis, the following is recommended:  If you are 30-31 years old, get at least 1,000 mg of calcium and at least 600 mg of vitamin D per day.  If you are older than age 41 but younger than age 17, get at least 1,200 mg of calcium and at least 600 mg of vitamin D per day.  If you are older than age 45, get at least 1,200 mg of calcium and at least 800 mg of vitamin D per day.  Smoking and excessive alcohol intake increase the risk of osteoporosis. Eat foods that are rich in calcium and vitamin D, and do weight-bearing exercises several times each week as directed by your health care provider. What should I know about how menopause affects my mental health? Depression may occur at any age, but it is more common as you become older. Common symptoms of depression  include:  Low or sad mood.  Changes in sleep patterns.  Changes in appetite or eating patterns.  Feeling an overall lack of motivation or enjoyment of activities that you previously enjoyed.  Frequent crying spells.  Talk with your health care provider if you think that you are experiencing depression. What should I know about immunizations? It is important that you get and maintain your immunizations. These include:  Tetanus, diphtheria, and pertussis (Tdap) booster vaccine.  Influenza every year before the flu season begins.  Pneumonia vaccine.  Shingles vaccine.  Your health care provider may also recommend other immunizations. This information is not intended to replace advice given to you by your health care provider. Make sure you discuss any questions you have with your health care provider. Document Released: 01/23/2006  Document Revised: 06/20/2016 Document Reviewed: 09/04/2015 Elsevier Interactive Patient Education  2018 [employer]  

## 2018-10-22 ENCOUNTER — Other Ambulatory Visit: Payer: Self-pay | Admitting: Women's Health

## 2018-10-22 ENCOUNTER — Ambulatory Visit
Admission: RE | Admit: 2018-10-22 | Discharge: 2018-10-22 | Disposition: A | Discharge status: Home / Self Care | Payer: PRIVATE HEALTH INSURANCE | Source: Ambulatory Visit | Attending: Women's Health | Admitting: Women's Health

## 2018-10-22 DIAGNOSIS — Z1231 Encounter for screening mammogram for malignant neoplasm of breast: Secondary | ICD-10-CM

## 2018-10-22 LAB — URINALYSIS, COMPLETE W/RFL CULTURE
BACTERIA UA: NONE SEEN /HPF
Bilirubin Urine: NEGATIVE
Glucose, UA: NEGATIVE
HYALINE CAST: NONE SEEN /LPF
Hgb urine dipstick: NEGATIVE
Ketones, ur: NEGATIVE
Nitrites, Initial: NEGATIVE
Protein, ur: NEGATIVE
RBC / HPF: NONE SEEN /HPF (ref 0–2)
Specific Gravity, Urine: 1.022 (ref 1.001–1.03)
pH: 5.5 (ref 5.0–8.0)

## 2018-10-22 LAB — URINE CULTURE
MICRO NUMBER: 91341972
RESULT: NO GROWTH
SPECIMEN QUALITY: ADEQUATE

## 2018-10-22 LAB — CULTURE INDICATED

## 2018-11-02 ENCOUNTER — Ambulatory Visit: Payer: PRIVATE HEALTH INSURANCE

## 2019-01-13 IMAGING — MG DIGITAL SCREENING BILATERAL MAMMOGRAM WITH TOMO AND CAD
8 series · 9 of 24 positions shown · non-contrast
Comparison: Previous exam(s).

CLINICAL DATA: Screening.

EXAM:
DIGITAL SCREENING BILATERAL MAMMOGRAM WITH TOMO AND CAD

[L CC synth-2D]
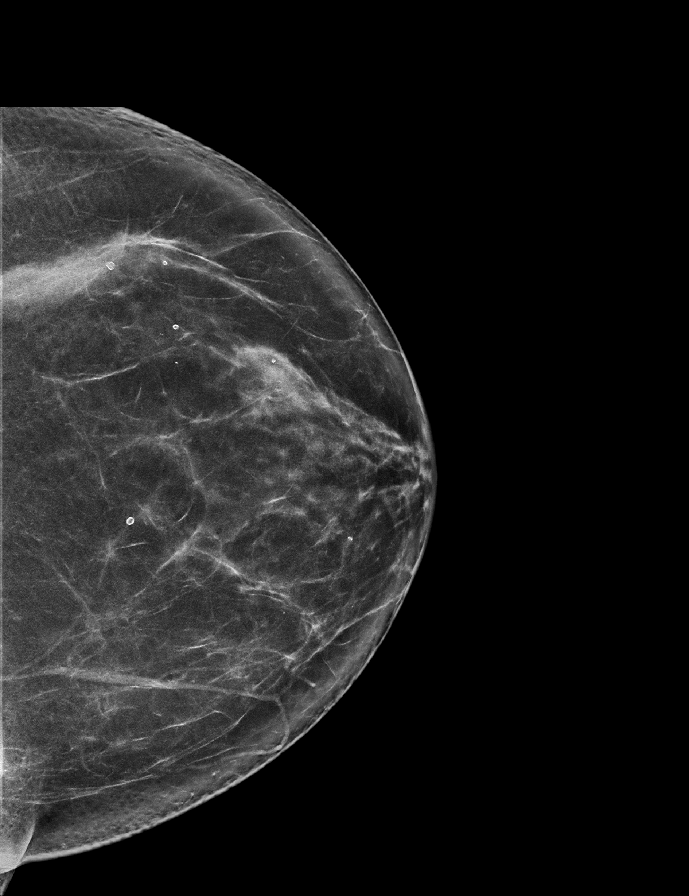

[R MLO synth-2D]
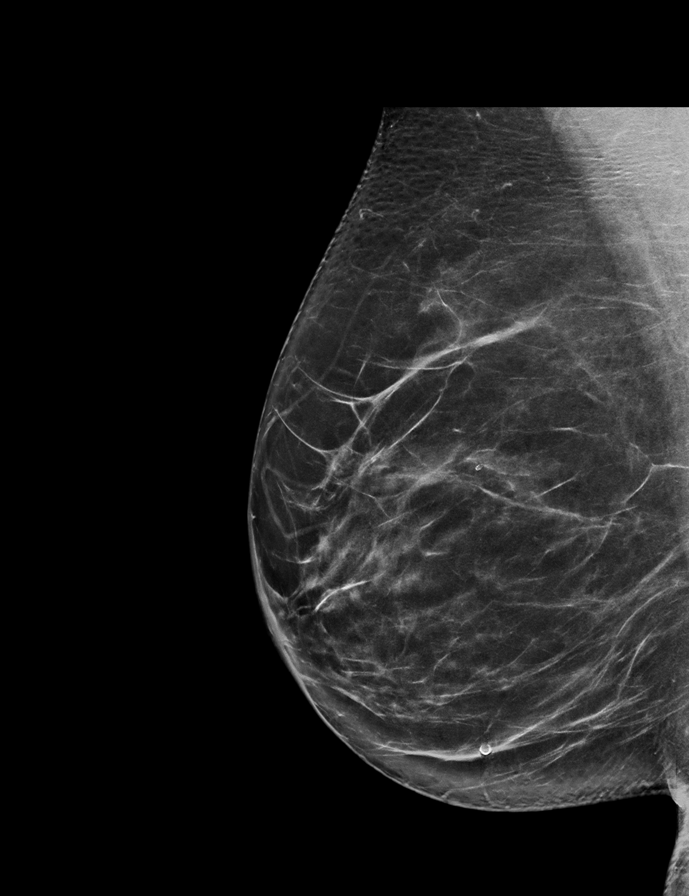

[R CC synth-2D]
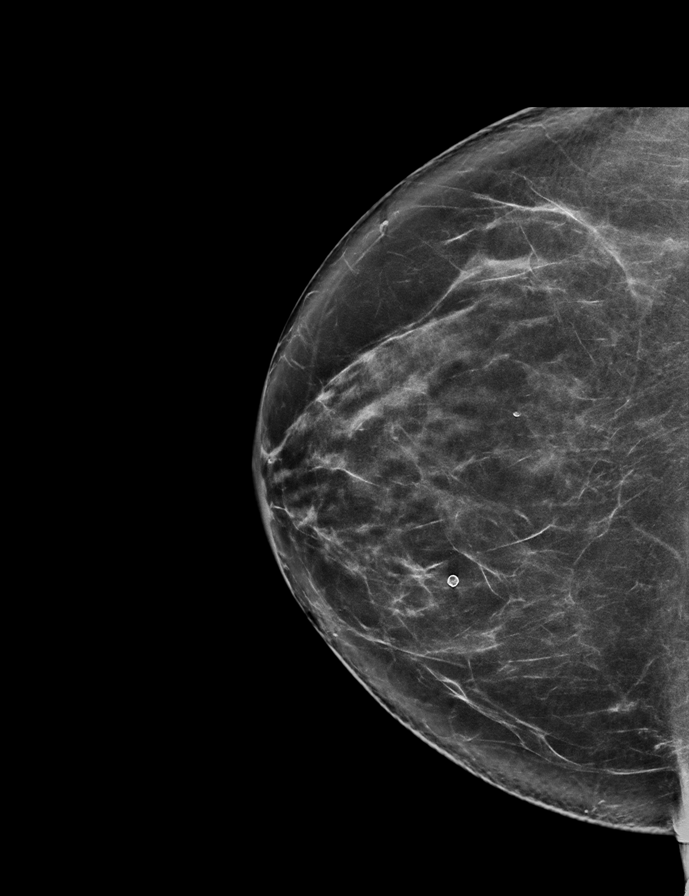

[L MLO synth-2D]
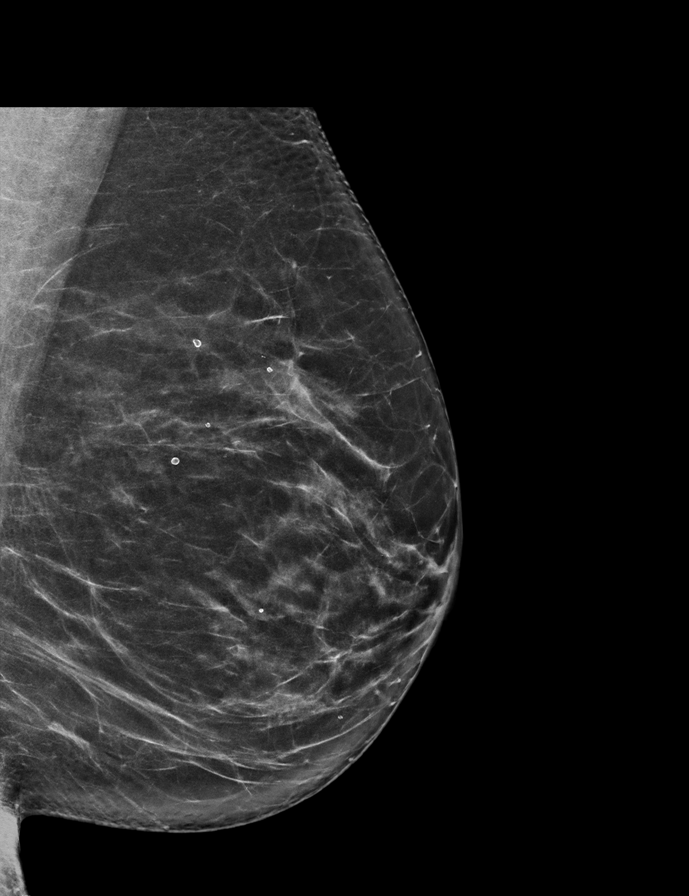

[R MLO tomo · 2 of 83 frames shown]
[frame 27/83]
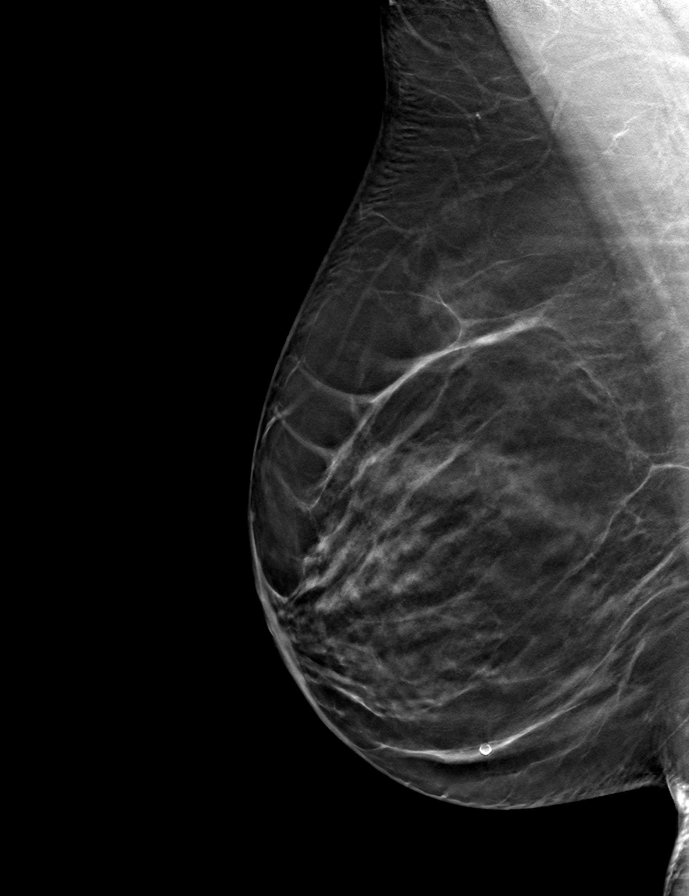
[frame 42/83]
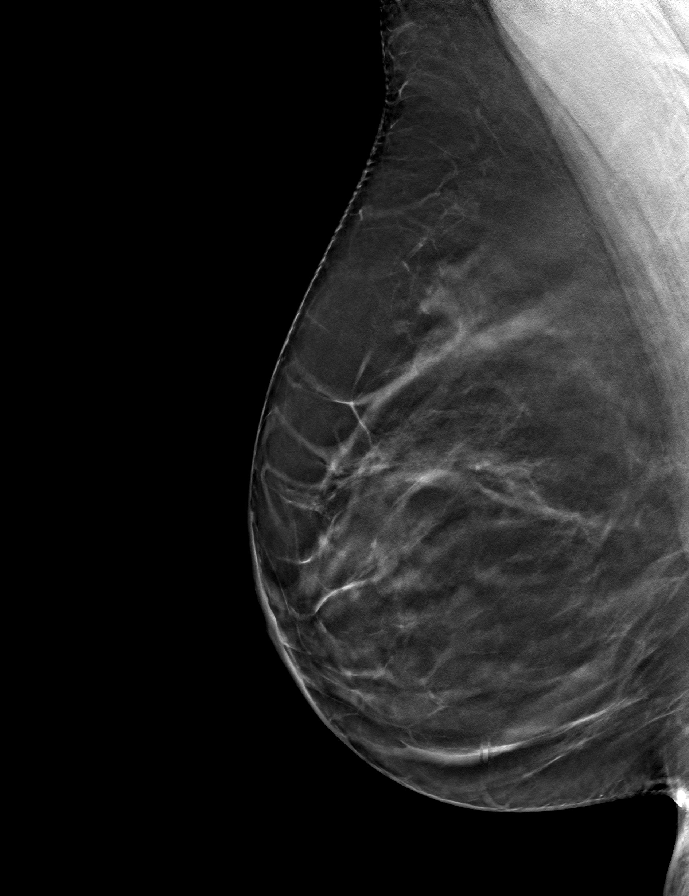

[L MLO tomo · tomo slice 40/79.0]
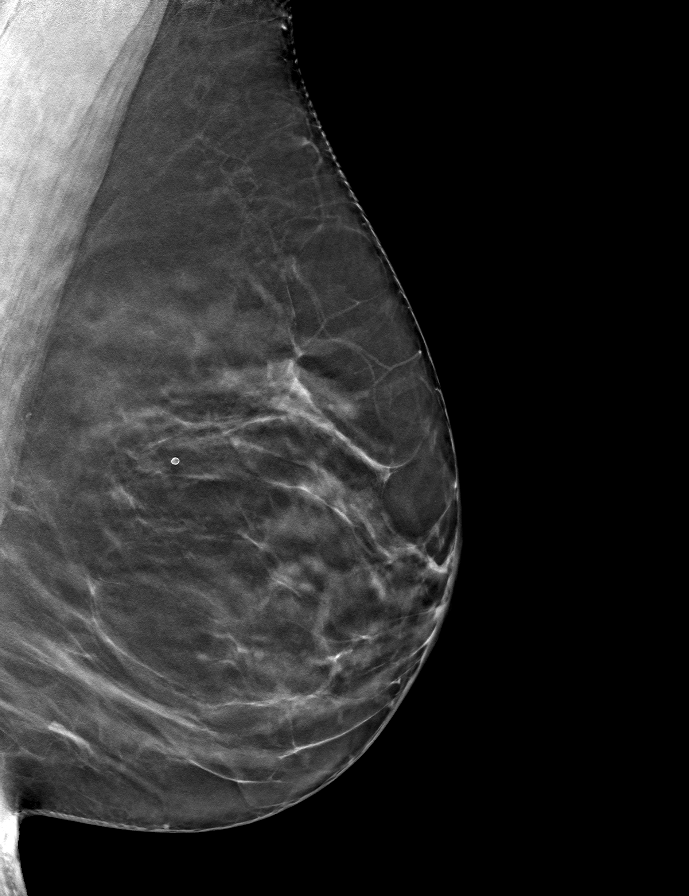

[R CC tomo · tomo slice 42/83.0]
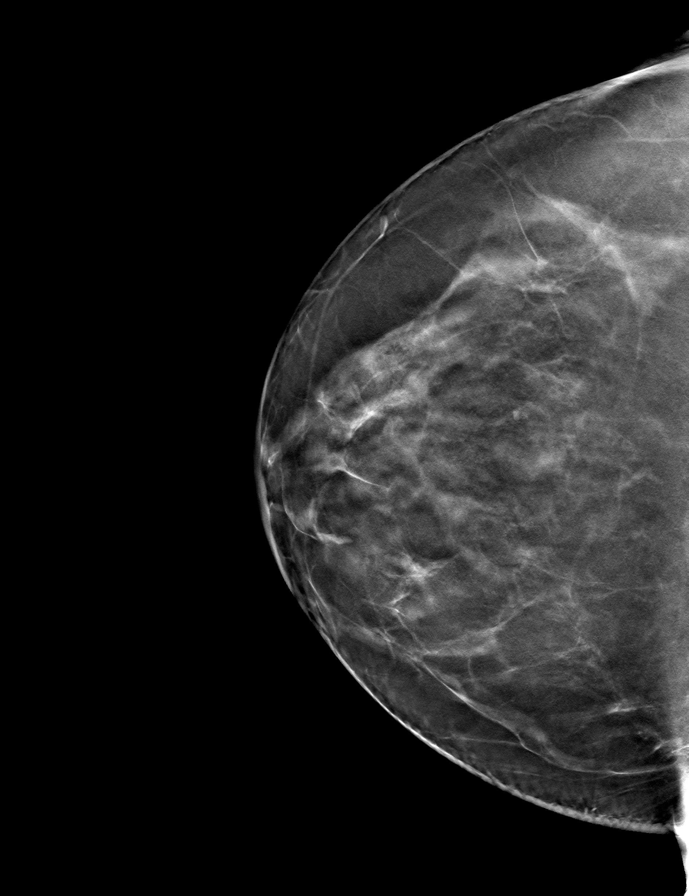

[L CC tomo · tomo slice 41/81.0]
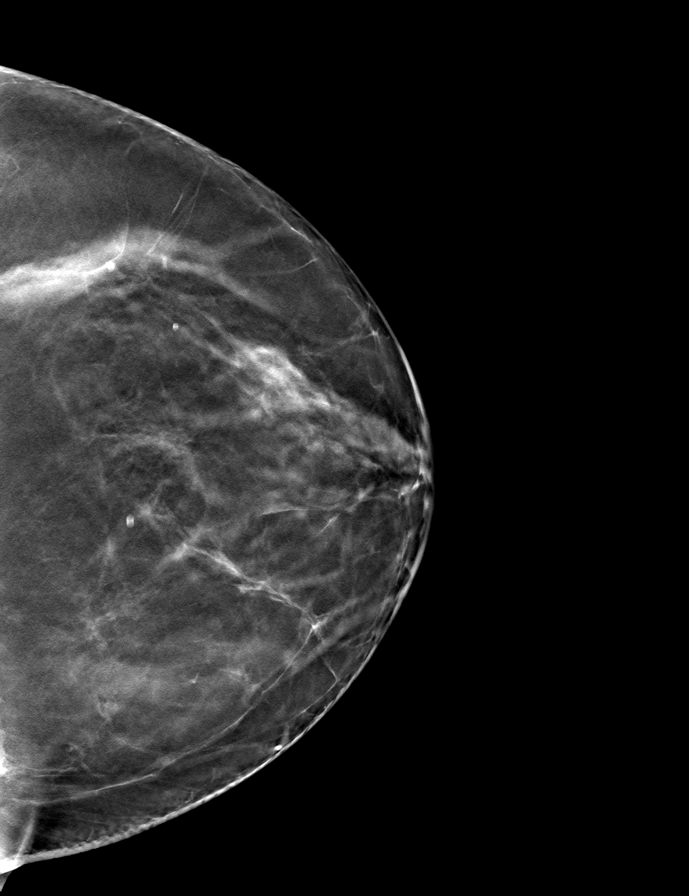

[9 of 24 positions shown; findings below may reference images not displayed]

ACR Breast Density Category c: The breast tissue is heterogeneously
dense, which may obscure small masses.
FINDINGS: There are no findings suspicious for malignancy. Images were
processed with CAD.
IMPRESSION: No mammographic evidence of malignancy. A result letter of this
screening mammogram will be mailed directly to the patient.

RECOMMENDATION:
Screening mammogram in one year. (Code:[id number])

BI-RADS CATEGORY  1: Negative.

## 2019-09-13 ENCOUNTER — Encounter: Payer: Self-pay | Admitting: Gynecology

## 2019-09-28 ENCOUNTER — Encounter: Payer: Self-pay | Admitting: Women's Health

## 2019-10-03 ENCOUNTER — Other Ambulatory Visit: Payer: Self-pay | Admitting: Women's Health

## 2019-10-03 DIAGNOSIS — Z01419 Encounter for gynecological examination (general) (routine) without abnormal findings: Secondary | ICD-10-CM

## 2019-10-03 DIAGNOSIS — Z13220 Encounter for screening for lipoid disorders: Secondary | ICD-10-CM

## 2019-10-25 ENCOUNTER — Other Ambulatory Visit: Payer: Self-pay

## 2019-10-26 ENCOUNTER — Ambulatory Visit (INDEPENDENT_AMBULATORY_CARE_PROVIDER_SITE_OTHER): Payer: PRIVATE HEALTH INSURANCE | Admitting: Women's Health

## 2019-10-26 ENCOUNTER — Encounter: Payer: Self-pay | Admitting: Women's Health

## 2019-10-26 VITALS — BP 132/80 | Ht 63.0 in

## 2019-10-26 DIAGNOSIS — Z01419 Encounter for gynecological examination (general) (routine) without abnormal findings: Secondary | ICD-10-CM

## 2019-10-26 DIAGNOSIS — R7309 Other abnormal glucose: Secondary | ICD-10-CM | POA: Diagnosis not present

## 2019-10-26 DIAGNOSIS — Z13820 Encounter for screening for osteoporosis: Secondary | ICD-10-CM | POA: Diagnosis not present

## 2019-10-26 NOTE — Patient Instructions (Signed)
It was good to see you today  Health Maintenance for Postmenopausal Women Menopause is a normal process in which your ability to get pregnant comes to an end. This process happens slowly over many months or years, usually between the ages of 65 and 25. Menopause is complete when you have missed your menstrual periods for 12 months. It is important to talk with your health care provider about some of the most common conditions that affect women after menopause (postmenopausal women). These include heart disease, cancer, and bone loss (osteoporosis). Adopting a healthy lifestyle and getting preventive care can help to promote your health and wellness. The actions you take can also lower your chances of developing some of these common conditions. What should I know about menopause? During menopause, you may get a number of symptoms, such as:  Hot flashes. These can be moderate or severe.  Night sweats.  Decrease in sex drive.  Mood swings.  Headaches.  Tiredness.  Irritability.  Memory problems.  Insomnia. Choosing to treat or not to treat these symptoms is a decision that you make with your health care provider. Do I need hormone replacement therapy?  Hormone replacement therapy is effective in treating symptoms that are caused by menopause, such as hot flashes and night sweats.  Hormone replacement carries certain risks, especially as you become older. If you are thinking about using estrogen or estrogen with progestin, discuss the benefits and risks with your health care provider. What is my risk for heart disease and stroke? The risk of heart disease, heart attack, and stroke increases as you age. One of the causes may be a change in the body's hormones during menopause. This can affect how your body uses dietary fats, triglycerides, and cholesterol. Heart attack and stroke are medical emergencies. There are many things that you can do to help prevent heart disease and stroke. Watch  your blood pressure  High blood pressure causes heart disease and increases the risk of stroke. This is more likely to develop in people who have high blood pressure readings, are of African descent, or are overweight.  Have your blood pressure checked: ? Every 3-5 years if you are 52-60 years of age. ? Every year if you are 45 years old or older. Eat a healthy diet   Eat a diet that includes plenty of vegetables, fruits, low-fat dairy products, and lean protein.  Do not eat a lot of foods that are high in solid fats, added sugars, or sodium. Get regular exercise Get regular exercise. This is one of the most important things you can do for your health. Most adults should:  Try to exercise for at least 150 minutes each week. The exercise should increase your heart rate and make you sweat (moderate-intensity exercise).  Try to do strengthening exercises at least twice each week. Do these in addition to the moderate-intensity exercise.  Spend less time sitting. Even light physical activity can be beneficial. Other tips  Work with your health care provider to achieve or maintain a healthy weight.  Do not use any products that contain nicotine or tobacco, such as cigarettes, e-cigarettes, and chewing tobacco. If you need help quitting, ask your health care provider.  Know your numbers. Ask your health care provider to check your cholesterol and your blood sugar (glucose). Continue to have your blood tested as directed by your health care provider. Do I need screening for cancer? Depending on your health history and family history, you may need to have cancer  screening at different stages of your life. This may include screening for:  Breast cancer.  Cervical cancer.  Lung cancer.  Colorectal cancer. What is my risk for osteoporosis? After menopause, you may be at increased risk for osteoporosis. Osteoporosis is a condition in which bone destruction happens more quickly than new bone  creation. To help prevent osteoporosis or the bone fractures that can happen because of osteoporosis, you may take the following actions:  If you are 39-50 years old, get at least 1,000 mg of calcium and at least 600 mg of vitamin D per day.  If you are older than age 80 but younger than age 77, get at least 1,200 mg of calcium and at least 600 mg of vitamin D per day.  If you are older than age 70, get at least 1,200 mg of calcium and at least 800 mg of vitamin D per day. Smoking and drinking excessive alcohol increase the risk of osteoporosis. Eat foods that are rich in calcium and vitamin D, and do weight-bearing exercises several times each week as directed by your health care provider. How does menopause affect my mental health? Depression may occur at any age, but it is more common as you become older. Common symptoms of depression include:  Low or sad mood.  Changes in sleep patterns.  Changes in appetite or eating patterns.  Feeling an overall lack of motivation or enjoyment of activities that you previously enjoyed.  Frequent crying spells. Talk with your health care provider if you think that you are experiencing depression. General instructions See your health care provider for regular wellness exams and vaccines. This may include:  Scheduling regular health, dental, and eye exams.  Getting and maintaining your vaccines. These include: ? Influenza vaccine. Get this vaccine each year before the flu season begins. ? Pneumonia vaccine. ? Shingles vaccine. ? Tetanus, diphtheria, and pertussis (Tdap) booster vaccine. Your health care provider may also recommend other immunizations. Tell your health care provider if you have ever been abused or do not feel safe at home. Summary  Menopause is a normal process in which your ability to get pregnant comes to an end.  This condition causes hot flashes, night sweats, decreased interest in sex, mood swings, headaches, or lack of  sleep.  Treatment for this condition may include hormone replacement therapy.  Take actions to keep yourself healthy, including exercising regularly, eating a healthy diet, watching your weight, and checking your blood pressure and blood sugar levels.  Get screened for cancer and depression. Make sure that you are up to date with all your vaccines. This information is not intended to replace advice given to you by your health care provider. Make sure you discuss any questions you have with your health care provider. Document Released: 01/23/2006 Document Revised: 11/24/2018 Document Reviewed: 11/24/2018 Elsevier Patient Education  2020 [employer].

## 2019-10-26 NOTE — Progress Notes (Signed)
KAELLY TROUPE 08/17/1961 [id number]    History:    Presents for annual exam.  Postmenopausal on no HRT with no bleeding.  Not sexually active husband history of prostate cancer.  Normal Pap and mammogram history.  2019 benign colon polyp.  Has not had a screening DEXA.  Primary care manages hypertension and hypercholesteremia.  Past medical history, past surgical history, family history and social history were all reviewed and documented in the EPIC chart.  Works at [employer].  Mother deceased pancreatic cancer, father deceased from heart disease in his 54s.  Brother type 2 diabetes on insulin.  2 children both doing well and has 2 grandchildren.  ROS:  A ROS was performed and pertinent positives and negatives are included.  Exam:  Vitals:   10/26/19 0801  BP: 132/80  Height: 5\' 3"  (1.6 m)   Body mass index is 29.76 kg/m.   General appearance:  Normal Thyroid:  Symmetrical, normal in size, without palpable masses or nodularity. Respiratory  Auscultation:  Clear without wheezing or rhonchi Cardiovascular  Auscultation:  Regular rate, without rubs, murmurs or gallops  Edema/varicosities:  Not grossly evident Abdominal  Soft,nontender, without masses, guarding or rebound.  Liver/spleen:  No organomegaly noted  Hernia:  None appreciated  Skin  Inspection:  Grossly normal   Breasts: Examined lying and sitting.     Right: Without masses, retractions, discharge or axillary adenopathy.     Left: Without masses, retractions, discharge or axillary adenopathy. Gentitourinary   Inguinal/mons:  Normal without inguinal adenopathy  External genitalia:  Normal  BUS/Urethra/Skene's glands:  Normal  Vagina:  Normal  Cervix:  Normal  Uterus:  normal in size, shape and contour.  Midline and mobile  Adnexa/parametria:     Rt: Without masses or tenderness.   Lt: Without masses or tenderness.  Anus and perineum: Normal  Digital rectal exam: Normal sphincter tone without palpated  masses or tenderness  Assessment/Plan:  58 y.o. MWF G2 P2 for annual exam with no complaints.  Postmenopausal no HRT with no bleeding Hypertension, hypercholesteremia primary care manages meds and labs  Plan: SBEs, continue annual screening mammogram due this month.  Continue to work on increasing exercise recently bought a stationary bike, calcium rich foods, vitamin D 2000 daily and decrease calories/carbs encouraged.  Shingrix planning to get today.  Has had the flu vaccine this year.  Labs reviewed, glucose 106 we will check a hemoglobin A1c.  Aware of need to decrease simple sugars.  Pap normal 2018, new screening guidelines reviewed.    [place], 8:25 AM 10/26/2019

## 2019-10-27 ENCOUNTER — Other Ambulatory Visit: Payer: Self-pay | Admitting: *Deleted

## 2019-10-27 DIAGNOSIS — R7303 Prediabetes: Secondary | ICD-10-CM

## 2019-10-27 LAB — HEMOGLOBIN A1C
Hgb A1c MFr Bld: 6 % of total Hgb — ABNORMAL HIGH (ref <5.7)
Mean Plasma Glucose: 126 (calc)
eAG (mmol/L): 7 (calc)

## 2019-10-28 LAB — URINE CULTURE
MICRO NUMBER:: 1089023
Result:: NO GROWTH
SPECIMEN QUALITY:: ADEQUATE

## 2019-10-28 LAB — URINALYSIS, COMPLETE W/RFL CULTURE
Bacteria, UA: NONE SEEN /HPF
Bilirubin Urine: NEGATIVE
Glucose, UA: NEGATIVE
Hgb urine dipstick: NEGATIVE
Hyaline Cast: NONE SEEN /LPF
Ketones, ur: NEGATIVE
Nitrites, Initial: NEGATIVE
Protein, ur: NEGATIVE
RBC / HPF: NONE SEEN /HPF (ref 0–2)
Specific Gravity, Urine: 1.017 (ref 1.001–1.03)
pH: 7.5 (ref 5.0–8.0)

## 2019-10-28 LAB — CULTURE INDICATED

## 2020-01-27 ENCOUNTER — Ambulatory Visit: Payer: PRIVATE HEALTH INSURANCE | Attending: Internal Medicine

## 2020-01-27 DIAGNOSIS — Z23 Encounter for immunization: Secondary | ICD-10-CM | POA: Insufficient documentation

## 2020-01-27 NOTE — Progress Notes (Signed)
   Covid-19 Vaccination Clinic  Name:  GINETTE WIEGMANN    MRN: [id number] DOB: May 31, 1961  01/27/2020  Ms. Attias was observed post Covid-19 immunization for 15 minutes without incidence. She was provided with Vaccine Information Sheet and instruction to access the V-Safe system.   Ms. Willcutt was instructed to call 911 with any severe reactions post vaccine: Marland Kitchen Difficulty breathing  . Swelling of your face and throat  . A fast heartbeat  . A bad rash all over your body  . Dizziness and weakness    Immunizations Administered    Name Date Dose VIS Date Route   Pfizer COVID-19 Vaccine 01/27/2020  9:04 AM 0.3 mL 11/25/2019 Intramuscular   Manufacturer: [place]   Lot: [id number]   [place]: [id number]

## 2020-02-13 ENCOUNTER — Encounter: Payer: Self-pay | Admitting: Cardiovascular Disease

## 2020-02-13 NOTE — Progress Notes (Signed)
Discussed issues with frequent migraines and whether or not a PFO may be involved. Previous echocardiogram in 2009 did not employ saline contrast. We will schedule for a saline contrast echo at her convenience (she prefers to wait until after she receives her second dose of COVID-19 vaccine, currently scheduled for March 6.

## 2020-02-14 ENCOUNTER — Other Ambulatory Visit: Payer: Self-pay | Admitting: *Deleted

## 2020-02-14 DIAGNOSIS — Q211 Atrial septal defect: Secondary | ICD-10-CM

## 2020-02-14 DIAGNOSIS — Q2112 Patent foramen ovale: Secondary | ICD-10-CM

## 2020-02-18 ENCOUNTER — Ambulatory Visit: Payer: PRIVATE HEALTH INSURANCE | Attending: Internal Medicine

## 2020-02-18 DIAGNOSIS — Z23 Encounter for immunization: Secondary | ICD-10-CM | POA: Insufficient documentation

## 2020-02-18 NOTE — Progress Notes (Signed)
   Covid-19 Vaccination Clinic  Name:  MONIFAH NARDIELLO    MRN: [id number] DOB: [birth year]  02/18/2020  Ms. Andren was observed post Covid-19 immunization for 15 minutes without incident. She was provided with Vaccine Information Sheet and instruction to access the V-Safe system.   Ms. Furrow was instructed to call 911 with any severe reactions post vaccine: Marland Kitchen Difficulty breathing  . Swelling of face and throat  . A fast heartbeat  . A bad rash all over body  . Dizziness and weakness   Immunizations Administered    Name Date Dose VIS Date Route   Pfizer COVID-19 Vaccine 02/18/2020  2:22 PM 0.3 mL 11/25/2019 Intramuscular   Manufacturer: [place]   Lot: [id number]   [place]: [id number]

## 2020-04-26 ENCOUNTER — Other Ambulatory Visit (HOSPITAL_COMMUNITY): Payer: PRIVATE HEALTH INSURANCE

## 2020-05-09 ENCOUNTER — Other Ambulatory Visit: Payer: Self-pay | Admitting: Obstetrics & Gynecology

## 2020-05-09 DIAGNOSIS — Z1231 Encounter for screening mammogram for malignant neoplasm of breast: Secondary | ICD-10-CM

## 2020-06-28 ENCOUNTER — Ambulatory Visit (INDEPENDENT_AMBULATORY_CARE_PROVIDER_SITE_OTHER): Payer: Managed Care, Other (non HMO)

## 2020-06-28 ENCOUNTER — Ambulatory Visit (INDEPENDENT_AMBULATORY_CARE_PROVIDER_SITE_OTHER): Payer: 59 | Admitting: Podiatry

## 2020-06-28 ENCOUNTER — Encounter: Payer: Self-pay | Admitting: Podiatry

## 2020-06-28 ENCOUNTER — Other Ambulatory Visit: Payer: Self-pay | Admitting: *Deleted

## 2020-06-28 ENCOUNTER — Other Ambulatory Visit: Payer: Self-pay

## 2020-06-28 DIAGNOSIS — M79672 Pain in left foot: Secondary | ICD-10-CM | POA: Diagnosis not present

## 2020-06-28 DIAGNOSIS — D3610 Benign neoplasm of peripheral nerves and autonomic nervous system, unspecified: Secondary | ICD-10-CM

## 2020-06-28 DIAGNOSIS — M779 Enthesopathy, unspecified: Secondary | ICD-10-CM | POA: Diagnosis not present

## 2020-06-28 DIAGNOSIS — M7752 Other enthesopathy of left foot: Secondary | ICD-10-CM | POA: Diagnosis not present

## 2020-06-28 DIAGNOSIS — Q2112 Patent foramen ovale: Secondary | ICD-10-CM

## 2020-06-28 MED ORDER — MELOXICAM 15 MG PO TABS
15.0000 mg | ORAL_TABLET | Freq: Every day | ORAL | 0 refills | Status: DC
Start: 2020-06-28 — End: 2020-07-27

## 2020-07-02 ENCOUNTER — Other Ambulatory Visit: Payer: Self-pay | Admitting: Podiatry

## 2020-07-04 NOTE — Progress Notes (Signed)
Subjective:   Patient ID: Julie Lucero, female   DOB: 59 y.o.   MRN: [id number]   HPI 59 year old female presents the office today for concerns of left foot pain most of the Disch of her foot pointing submetatarsal 2.  She describes a burning feeling radiating to the toe as well which is been ongoing for last 3 weeks.  She states it hurts to bend the toes at times.  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  She denies any recent injury or trauma.  No redness or warmth.   Review of Systems  All other systems reviewed and are negative.  Past Medical History:  Diagnosis Date  . Anal fissure 2006  . Elevated cholesterol   . Endometrial polyp   . Headache   . Sleep disturbance     Past Surgical History:  Procedure Laterality Date  . DILATION AND CURETTAGE OF UTERUS  2009   endometrial polyp  . DOPPLER ECHOCARDIOGRAPHY  04/27/2008   EF =>55%;lv fx normal  . WRIST SURGERY       Current Outpatient Medications:  .  Ascorbic Acid (VITAMIN C PO), Take 1 tablet by mouth daily., Disp: , Rfl:  .  Cholecalciferol (VITAMIN D PO), Take 1 tablet by mouth daily., Disp: , Rfl:  .  meloxicam (MOBIC) 15 MG tablet, Take 1 tablet (15 mg total) by mouth daily., Disp: 30 tablet, Rfl: 0 .  metoprolol succinate (TOPROL-XL) 50 MG 24 hr tablet, Take 50 mg by mouth daily. Take with or immediately following a meal., Disp: , Rfl:  .  rosuvastatin (CRESTOR) 40 MG tablet, 10 mg daily., Disp: , Rfl:   No Known Allergies        Objective:  Physical Exam  General: AAO x3, NAD  Dermatological: Skin is warm, dry and supple bilateral. Nails x 10 are well manicured; remaining integument appears unremarkable at this time. There are no open sores, no preulcerative lesions, no rash or signs of infection present.  Vascular: Dorsalis Pedis artery and Posterior Tibial artery pedal pulses are 2/4 bilateral with immedate capillary fill time. Pedal hair growth present. No varicosities and no lower  extremity edema present bilateral. There is no pain with calf compression, swelling, warmth, erythema.   Neruologic: Grossly intact via light touch bilateral.   Musculoskeletal: On the left foot there is tenderness submetatarsal 2 as well as the second interspace in the left foot.  There is localized edema but no erythema or warmth.  There is no other areas of pinpoint tenderness identified at this time.  Muscular strength 5/5 in all groups tested bilateral.  Gait: Unassisted, Nonantalgic.       Assessment:    59 year old female left second MPJ capsulitis, neuroma second interspace    Plan:  -Treatment options discussed including all alternatives, risks, and complications -Etiology of symptoms were discussed -X-rays were obtained and reviewed with the patient.  Acute fracture or stress fracture identified today. -Steroid injection was formed to the second interspace of the left foot.  The skin was prepped with alcohol.  A mixture of 1 cc, 10, 0.5 cc of Marcaine plain, 0.5 cc of lidocaine plain was infiltrated into the second interspace without complications.  Postinjection care was discussed. -Offloading pads -Discussed shoe modifications and orthotics  Return in about 6 weeks (around 08/09/2020), or if symptoms worsen or fail to improve.  Trula Slade DPM

## 2020-07-05 ENCOUNTER — Other Ambulatory Visit (HOSPITAL_COMMUNITY): Payer: PRIVATE HEALTH INSURANCE

## 2020-07-06 ENCOUNTER — Other Ambulatory Visit: Payer: Self-pay | Admitting: Podiatry

## 2020-07-06 DIAGNOSIS — M779 Enthesopathy, unspecified: Secondary | ICD-10-CM

## 2020-07-06 DIAGNOSIS — D3610 Benign neoplasm of peripheral nerves and autonomic nervous system, unspecified: Secondary | ICD-10-CM

## 2020-07-27 ENCOUNTER — Other Ambulatory Visit: Payer: Self-pay | Admitting: Podiatry

## 2020-07-31 ENCOUNTER — Telehealth: Payer: Self-pay | Admitting: Cardiovascular Disease

## 2020-07-31 NOTE — Telephone Encounter (Signed)
Follow up:     Patient returning the nurse call concering a apt. Please call patient back.

## 2020-07-31 NOTE — Telephone Encounter (Signed)
Spoke with pt, she confirmed her echo with bubble study is okay on 10-25-20.

## 2020-08-09 ENCOUNTER — Ambulatory Visit: Payer: PRIVATE HEALTH INSURANCE | Admitting: Podiatry

## 2020-09-10 ENCOUNTER — Ambulatory Visit: Payer: PRIVATE HEALTH INSURANCE | Admitting: Podiatry

## 2020-09-26 ENCOUNTER — Telehealth: Payer: Self-pay | Admitting: *Deleted

## 2020-09-26 NOTE — Telephone Encounter (Signed)
Patient has annual exam scheduled on 10/31/20 would like to have labs done prior to annual exam to be drawn at her [place] lab at her job. Let me know what labs you want her to have and I will fax the order. Please advise

## 2020-09-27 NOTE — Telephone Encounter (Signed)
Patient informed orders faxed to [phone] per patient request.

## 2020-09-27 NOTE — Telephone Encounter (Signed)
CBC, CMP, lipid, A1C. Thank you

## 2020-10-25 ENCOUNTER — Other Ambulatory Visit (HOSPITAL_COMMUNITY): Payer: PRIVATE HEALTH INSURANCE

## 2020-10-26 ENCOUNTER — Other Ambulatory Visit (HOSPITAL_COMMUNITY): Payer: PRIVATE HEALTH INSURANCE

## 2020-10-31 ENCOUNTER — Encounter: Payer: Self-pay | Admitting: Nurse Practitioner

## 2020-10-31 ENCOUNTER — Ambulatory Visit (INDEPENDENT_AMBULATORY_CARE_PROVIDER_SITE_OTHER): Payer: Managed Care, Other (non HMO) | Admitting: Nurse Practitioner

## 2020-10-31 ENCOUNTER — Other Ambulatory Visit: Payer: Self-pay

## 2020-10-31 VITALS — BP 122/78 | Ht 64.0 in

## 2020-10-31 DIAGNOSIS — Z01419 Encounter for gynecological examination (general) (routine) without abnormal findings: Secondary | ICD-10-CM

## 2020-10-31 DIAGNOSIS — Z13820 Encounter for screening for osteoporosis: Secondary | ICD-10-CM

## 2020-10-31 DIAGNOSIS — Z780 Asymptomatic menopausal state: Secondary | ICD-10-CM

## 2020-10-31 NOTE — Progress Notes (Signed)
   Julie Lucero 04/01/1961 [id number]   History:  59 y.o. G2P2002 presents for annual exam. Postmenopausal - no HRT, no bleeding. Normal pap and mammogram history. History of migraine with aura, pre-diabetes, HDL, HTN managed by primary care.  Gynecologic History No LMP recorded. Patient is postmenopausal.   Contraception: post menopausal status Last Pap: 10/12/2017. Results were: normal Last mammogram: 10/2018. Results were: normal Last colonoscopy: 2019. Results were: polyps, 3-5 year recall Last Dexa: Never  Past medical history, past surgical history, family history and social history were all reviewed and documented in the EPIC chart.  ROS:  A ROS was performed and pertinent positives and negatives are included.  Exam:  Vitals:   10/31/20 0806  BP: 122/78  Height: 5\' 4"  (1.626 m)   Body mass index is 28.84 kg/m.  General appearance:  Normal Thyroid:  Symmetrical, normal in size, without palpable masses or nodularity. Respiratory  Auscultation:  Clear without wheezing or rhonchi Cardiovascular  Auscultation:  Regular rate, without rubs, murmurs or gallops  Edema/varicosities:  Not grossly evident Abdominal  Soft,nontender, without masses, guarding or rebound.  Liver/spleen:  No organomegaly noted  Hernia:  None appreciated  Skin  Inspection:  Grossly normal   Breasts: Examined lying and sitting.   Right: Without masses, retractions, discharge or axillary adenopathy.   Left: Without masses, retractions, discharge or axillary adenopathy. Gentitourinary   Inguinal/mons:  Normal without inguinal adenopathy  External genitalia:  Normal  BUS/Urethra/Skene's glands:  Normal  Vagina:  Normal  Cervix:  Normal  Uterus:  Normal in size, shape and contour.  Midline and mobile  Adnexa/parametria:     Rt: Without masses or tenderness.   Lt: Without masses or tenderness.  Anus and perineum: Normal  Digital rectal exam: Normal sphincter tone without palpated masses or  tenderness  Assessment/Plan:  59 y.o. [id number] for annual exam.   Well female exam with routine gynecological exam - Education provided on SBEs, importance of preventative screenings, current guidelines, high calcium diet, regular exercise, and multivitamin daily. Labs with PCP-brought to visit and reviewed them today. She has had some weight gain in the last 2 years. Discussed how weight loss with diet and exercise will improved A1C.   Screening for osteoporosis - Plan: DG Bone Density  Postmenopausal - Plan: DG Bone Density  Screening for cervical cancer -normal Pap history.  Will repeat at 5-year interval per guidelines.  Screening for breast cancer -normal mammogram history.  Overdue for screening mammogram.  Discussed current guidelines and importance of preventative screenings.  She plans to schedule this soon.  Normal breast exam today.  Screening for colon cancer -screening colonoscopy in 2019.  She will repeat in 3 to 5 years per GI's recommendation.  Follow up in 1 year for annual.     Tamela Gammon [facility], 8:37 AM 10/31/2020

## 2020-10-31 NOTE — Patient Instructions (Signed)

## 2020-11-07 ENCOUNTER — Encounter: Payer: Self-pay | Admitting: Nurse Practitioner

## 2020-12-06 DIAGNOSIS — R079 Chest pain, unspecified: Secondary | ICD-10-CM | POA: Insufficient documentation

## 2020-12-06 DIAGNOSIS — R1011 Right upper quadrant pain: Secondary | ICD-10-CM | POA: Insufficient documentation

## 2021-01-04 ENCOUNTER — Telehealth (HOSPITAL_COMMUNITY): Payer: Self-pay | Admitting: Cardiovascular Disease

## 2021-01-04 NOTE — Telephone Encounter (Signed)
Patient called and cancelled echocardiogram due to staff shortages at work and did not wish to reschedule. Order will be removed from the WQ.

## 2021-01-08 ENCOUNTER — Other Ambulatory Visit (HOSPITAL_COMMUNITY): Payer: PRIVATE HEALTH INSURANCE

## 2021-03-19 ENCOUNTER — Emergency Department (HOSPITAL_COMMUNITY)
Admission: EM | Admit: 2021-03-19 | Discharge: 2021-03-19 | Disposition: A | Discharge status: Home / Self Care | Payer: Managed Care, Other (non HMO) | Attending: Emergency Medicine | Admitting: Emergency Medicine

## 2021-03-19 ENCOUNTER — Emergency Department (HOSPITAL_COMMUNITY): Payer: Managed Care, Other (non HMO)

## 2021-03-19 ENCOUNTER — Encounter (HOSPITAL_COMMUNITY): Payer: Self-pay

## 2021-03-19 ENCOUNTER — Other Ambulatory Visit: Payer: Self-pay

## 2021-03-19 DIAGNOSIS — R000 Tachycardia, unspecified: Secondary | ICD-10-CM | POA: Insufficient documentation

## 2021-03-19 DIAGNOSIS — R002 Palpitations: Secondary | ICD-10-CM | POA: Diagnosis present

## 2021-03-19 DIAGNOSIS — R110 Nausea: Secondary | ICD-10-CM | POA: Insufficient documentation

## 2021-03-19 DIAGNOSIS — R42 Dizziness and giddiness: Secondary | ICD-10-CM | POA: Insufficient documentation

## 2021-03-19 DIAGNOSIS — Z87891 Personal history of nicotine dependence: Secondary | ICD-10-CM | POA: Diagnosis not present

## 2021-03-19 DIAGNOSIS — R0602 Shortness of breath: Secondary | ICD-10-CM | POA: Insufficient documentation

## 2021-03-19 LAB — CBC
HCT: 47.6 % — ABNORMAL HIGH (ref 36.0–46.0)
Hemoglobin: 15.6 g/dL — ABNORMAL HIGH (ref 12.0–15.0)
MCH: 29.9 pg (ref 26.0–34.0)
MCHC: 32.8 g/dL (ref 30.0–36.0)
MCV: 91.2 fL (ref 80.0–100.0)
Platelets: 210 10*3/uL (ref 150–400)
RBC: 5.22 MIL/uL — ABNORMAL HIGH (ref 3.87–5.11)
RDW: 13.9 % (ref 11.5–15.5)
WBC: 8.1 10*3/uL (ref 4.0–10.5)
nRBC: 0 % (ref 0.0–0.2)

## 2021-03-19 LAB — MAGNESIUM: Magnesium: 1.9 mg/dL (ref 1.7–2.4)

## 2021-03-19 LAB — BASIC METABOLIC PANEL
Anion gap: 11 (ref 5–15)
BUN: 13 mg/dL (ref 8–23)
CO2: 21 mmol/L — ABNORMAL LOW (ref 22–32)
Calcium: 10.1 mg/dL (ref 8.9–10.3)
Chloride: 106 mmol/L (ref 98–111)
Creatinine, Ser: 0.98 mg/dL (ref 0.44–1.00)
GFR, Estimated: >60 mL/min (ref >60)
Glucose, Bld: 150 mg/dL — ABNORMAL HIGH (ref 70–99)
Potassium: 3.5 mmol/L (ref 3.5–5.1)
Sodium: 138 mmol/L (ref 135–145)

## 2021-03-19 LAB — TROPONIN I (HIGH SENSITIVITY)
Troponin I (High Sensitivity): 5 ng/L (ref <18)
Troponin I (High Sensitivity): 5 ng/L (ref <18)

## 2021-03-19 LAB — TSH: TSH: 2.203 u[IU]/mL (ref 0.350–4.500)

## 2021-03-19 MED ORDER — METOPROLOL TARTRATE 25 MG PO TABS
50.0000 mg | ORAL_TABLET | Freq: Once | ORAL | Status: DC
  Filled 2021-03-19: qty 2

## 2021-03-19 NOTE — ED Triage Notes (Signed)
Pt states she was on her way to work when she notice increased HR. Pt reports she looked at her watch and her heart rate was 150-160's. Pt in triage was 130. Pt denies any sob or cp.

## 2021-03-19 NOTE — ED Provider Notes (Signed)
Assumed care from Dr. Christy Gentles.  Briefly 60 year old female who presented to the emergency department palpitations, was noted to be sinus tachycardia in triage at 130, on initial evaluation tachycardia has spontaneously resolved.  Since being in the department her heart rate has been normal, sinus.  No further symptoms.  Never had any chest pain or shortness of breath. Physical Exam  BP 128/80   Pulse 97   Temp (!) 97.2 F (36.2 C)   Resp 14   Ht 5\' 3"  (1.6 m)   Wt 78.5 kg   SpO2 99%   BMI 30.65 kg/m   Physical Exam Vitals and nursing note reviewed.  Constitutional:      Appearance: Normal appearance.  HENT:     Head: Normocephalic.     Mouth/Throat:     Mouth: Mucous membranes are moist.  Cardiovascular:     Rate and Rhythm: Normal rate.  Pulmonary:     Effort: Pulmonary effort is normal. No respiratory distress.     Breath sounds: No wheezing or rales.  Abdominal:     Palpations: Abdomen is soft.     Tenderness: There is no abdominal tenderness.  Musculoskeletal:        General: No swelling.  Skin:    General: Skin is warm.  Neurological:     Mental Status: She is alert and oriented to person, place, and time. Mental status is at baseline.  Psychiatric:        Mood and Affect: Mood normal.     ED Course/Procedures     Procedures  MDM  Patient has remained in a sinus rhythm, tachycardia spontaneously resolved.  On my evaluation she is sitting up, well-appearing, no complaints.  Blood work is reassuring, magnesium and TSH are normal, troponin is normal with no delta.  Low suspicion for PE given the absence of chest pain, shortness of breath/hypoxia and the tachycardia self resolved.  Discussed with the patient strict return to ED instructions including return palpitations, chest pain or shortness of breath.  She has outpatient follow-up with cardiology and will call the office today to get an appointment.  Patient will be discharged and treated as an outpatient.   Discharge plan and strict return to ED precautions discussed, patient verbalizes understanding and agreement.       Lorelle Gibbs, DO 03/19/21 1020

## 2021-03-19 NOTE — Discharge Instructions (Addendum)
You have been seen and discharged from the emergency department.  Your blood work was unremarkable, magnesium is normal, TSH was normal, heart enzyme was normal.  Follow-up with your primary provider for reevaluation and further care. Take home medications as prescribed. If you have any worsening symptoms or further concerns for health please return to an emergency department for further evaluation.

## 2021-03-19 NOTE — ED Provider Notes (Signed)
[place] EMERGENCY DEPARTMENT Provider Note   CSN: [id number] Arrival date & time: 03/19/21  0543     History Chief Complaint  Patient presents with  . Tachycardia    Julie Lucero is a 60 y.o. female.  The history is provided by the patient.  Palpitations Palpitations quality:  Fast Timing:  Constant Progression:  Improving Chronicity:  New Relieved by:  None tried Worsened by:  Nothing Associated symptoms: nausea and shortness of breath   Associated symptoms: no chest pain, no syncope and no vomiting   Risk factors: no heart disease, no hx of atrial fibrillation, no hx of DVT, no hx of PE and no hyperthyroidism   Patient history of hyperlipidemia, headache presents with palpitations.  Patient reports this past weekend, she missed doses of her Topamax and started feeling unwell and mild nausea.  She then began retaking the Topamax.  This morning she was going into work when she felt that her heart was racing and she felt lightheaded.  No chest pain but did have mild shortness of breath.  No syncope. No known history of atrial fibrillation or dysrhythmia. She reports when she looked at her watch her heart rate was in the 150s.     Past Medical History:  Diagnosis Date  . Anal fissure 2006  . Elevated cholesterol   . Endometrial polyp   . Headache   . Sleep disturbance     Patient Active Problem List   Diagnosis Date Noted  . Migraine with aura and without status migrainosus, not intractable 02/27/2015  . Intractable menstrual migraine with status migrainosus 02/27/2015  . Palpitations 08/17/2013  . Hypercholesteremia 08/17/2013    Past Surgical History:  Procedure Laterality Date  . DILATION AND CURETTAGE OF UTERUS  2009   endometrial polyp  . DOPPLER ECHOCARDIOGRAPHY  04/27/2008   EF =>55%;lv fx normal  . WRIST SURGERY       OB History    Gravida  2   Para  2   Term  2   Preterm      AB      Living  2     SAB      IAB       Ectopic      Multiple      Live Births              Family History  Problem Relation Age of Onset  . Diabetes Mother   . Cancer Mother        PANCREATIC  . Hypertension Father   . Heart disease Father   . Hypertension Sister   . Diabetes Brother   . Diabetes Paternal Grandmother   . Diabetes Paternal Grandfather   . Hypertension Paternal Grandfather   . Heart disease Paternal Grandfather   . Breast cancer Neg Hx     Social History   Tobacco Use  . Smoking status: Former Research scientist (life sciences)  . Smokeless tobacco: Never Used  Vaping Use  . Vaping Use: Never used  Substance Use Topics  . Alcohol use: Yes    Comment: Rare  . Drug use: No    Home Medications Prior to Admission medications   Medication Sig Start Date End Date Taking? Authorizing Provider  Cholecalciferol 50 MCG (2000 UT) CAPS Take 2,000 Units by mouth daily.   Yes [provider]  metoprolol succinate (TOPROL-XL) 50 MG 24 hr tablet Take 50 mg by mouth at bedtime.   Yes [provider]  pravastatin (  PRAVACHOL) 80 MG tablet Take 40 mg by mouth at bedtime. 12/25/20  Yes [provider]  rizatriptan (MAXALT) 10 MG tablet Take 10 mg by mouth as needed for migraine. 12/25/20  Yes [provider]  topiramate (TOPAMAX) 50 MG tablet Take 50 mg by mouth 2 (two) times daily. 01/31/21  Yes [provider]  meloxicam (MOBIC) 15 MG tablet TAKE ONE TABLET BY MOUTH ONE TIME DAILY Patient not taking: No sig reported 07/27/20   Trula Slade, DPM    Allergies    Patient has no known allergies.  Review of Systems   Review of Systems  Constitutional: Positive for fatigue. Negative for fever.  Respiratory: Positive for shortness of breath.   Cardiovascular: Positive for palpitations. Negative for chest pain and syncope.  Gastrointestinal: Positive for nausea. Negative for vomiting.  Neurological: Positive for light-headedness. Negative for syncope.  All other systems reviewed and  are negative.   Physical Exam Updated Vital Signs BP 136/81   Pulse 100   Temp 97.9 F (36.6 C) (Oral)   Resp 19   Ht 1.6 m (5\' 3" )   Wt 78.5 kg   SpO2 98%   BMI 30.65 kg/m   Physical Exam CONSTITUTIONAL: Well developed/well nourished, mildly anxious HEAD: Normocephalic/atraumatic EYES: EOMI/PERRL ENMT: Mucous membranes moist NECK: supple no meningeal signs SPINE/BACK:entire spine nontender CV: S1/S2 noted, no murmurs/rubs/gallops noted, tachycardic LUNGS: Lungs are clear to auscultation bilaterally, no apparent distress ABDOMEN: soft, nontender, no rebound or guarding, bowel sounds noted throughout abdomen GU:no cva tenderness NEURO: Pt is awake/alert/appropriate, moves all extremitiesx4.  No facial droop.   EXTREMITIES: pulses normal/equal, full ROM, no calf tenderness or edema SKIN: warm, color normal PSYCH: Patient conversant appropriate, mildly anxious  ED Results / Procedures / Treatments   Labs (all labs ordered are listed, but only abnormal results are displayed) Labs Reviewed  BASIC METABOLIC PANEL - Abnormal; Notable for the following components:      Result Value   CO2 21 (*)    Glucose, Bld 150 (*)    All other components within normal limits  CBC - Abnormal; Notable for the following components:   RBC 5.22 (*)    Hemoglobin 15.6 (*)    HCT 47.6 (*)    All other components within normal limits  MAGNESIUM  TSH  TROPONIN I (HIGH SENSITIVITY)  TROPONIN I (HIGH SENSITIVITY)    EKG EKG Interpretation  Date/Time:  Tuesday March 19 2021 06:01:19 EDT Ventricular Rate:  100 PR Interval:  195 QRS Duration: 93 QT Interval:  360 QTC Calculation: 465 R Axis:   96 Text Interpretation: Sinus tachycardia Right atrial enlargement Probable RVH w/ secondary repol abnormality Nonspecific T abnormalities, lateral leads Confirmed by Ripley Fraise ([id number]) on 03/19/2021 6:06:13 AM   Radiology DG Chest Port 1 View  Result Date: 03/19/2021 CLINICAL DATA:   Shortness of breath. EXAM: PORTABLE CHEST 1 VIEW COMPARISON:  No prior. FINDINGS: Mediastinum and hilar structures normal. Heart size normal. EKG leads noted over the chest. Faint density over the right apex most likely related to EKG lead. Mild atelectasis cannot be excluded. No pleural effusion or pneumothorax. No acute bony abnormality. IMPRESSION: EKG leads noted over the chest. Faint density of the right apex most likely related the kg leads. Mild atelectasis cannot be excluded. No acute cardiopulmonary disease otherwise noted. Electronically Signed   By: Marcello Moores  Register   On: 03/19/2021 06:23    Procedures Procedures   Medications Ordered in ED Medications - No data to  display  ED Course  I have reviewed the triage vital signs and the nursing notes.  Pertinent labs & imaging results that were available during my care of the patient were reviewed by me and considered in my medical decision making (see chart for details).    MDM Rules/Calculators/A&P                          6:54 AM Patient presents for palpitations.  Initial heart rate was in the 130s, but was a sinus tachycardia.  Repeat EKG is now in the 100s with a sinus rhythm. She reports taking her metoprolol last night.  Labs are pending at this time 7:20 AM Patient improved, will continue monitor. Signed out to Dr Fulton Reek at shift change to monitor Will need cardiology followup  Final Clinical Impression(s) / ED Diagnoses Final diagnoses:  None    Rx / DC Orders ED Discharge Orders    None       Ripley Fraise, MD 03/19/21 [phone]

## 2021-03-20 ENCOUNTER — Telehealth: Payer: Self-pay | Admitting: Cardiovascular Disease

## 2021-03-20 NOTE — Telephone Encounter (Signed)
Patient was seen in the ED and the Dr there told patient she needs to set up an appt in a week but Dr. Sallyanne Kuster is booked. Please advise

## 2021-03-20 NOTE — Telephone Encounter (Signed)
Spoke with the patient. Appointment made for 4/12 with Dr. Sallyanne Kuster

## 2021-03-26 ENCOUNTER — Other Ambulatory Visit: Payer: Self-pay

## 2021-03-26 ENCOUNTER — Encounter: Payer: Self-pay | Admitting: Cardiovascular Disease

## 2021-03-26 ENCOUNTER — Ambulatory Visit (INDEPENDENT_AMBULATORY_CARE_PROVIDER_SITE_OTHER): Payer: Managed Care, Other (non HMO) | Admitting: Cardiovascular Disease

## 2021-03-26 VITALS — BP 132/68 | HR 56 | Ht 63.25 in | Wt 174.0 lb

## 2021-03-26 DIAGNOSIS — E7800 Pure hypercholesterolemia, unspecified: Secondary | ICD-10-CM | POA: Diagnosis not present

## 2021-03-26 DIAGNOSIS — Q211 Atrial septal defect: Secondary | ICD-10-CM

## 2021-03-26 DIAGNOSIS — R000 Tachycardia, unspecified: Secondary | ICD-10-CM | POA: Diagnosis not present

## 2021-03-26 DIAGNOSIS — G43909 Migraine, unspecified, not intractable, without status migrainosus: Secondary | ICD-10-CM

## 2021-03-26 NOTE — Progress Notes (Signed)
Patient ID: Julie Lucero, female   DOB: 03-01-1961, 60 y.o.   MRN: [id number]    Cardiology Office Note:    Date:  03/29/2021   ID:  Julie Lucero, DOB 05/07/1961, MRN [id number]  PCP:  Gillian Scarce, MD   [facility]  Cardiologist:  No primary care provider on file. Maicie Vanderloop Advanced Practice Provider:  No care team member to display Electrophysiologist:  None       Referring MD: Gillian Scarce, MD   Chief Complaint  Patient presents with  . New Patient (Initial Visit)  . Tachycardia    History of Present Illness:    Julie Lucero is a 60 y.o. female with a hx of hypercholesterolemia, migraine headaches and palpitations, who returns after recently going to the ED 03/19/2021 for palpitations. This is her first office visit since 2015.  She inadvertently missed two doses of topamax and developed developed dizziness, nausea, mild dyspnea, diaphoresis and tachycardia. Her heart rate on arrival to ED was 154 and improved gradually. The next day it was in the low 100s and by now has returned to baseline in the 50s. Routine labs were normal, as was the troponin and TSH. The ECG showed sinus tachycardia.  She is now back to normal. She does not think that she ever missed the metoprolol.  Independent of these events she has been doing well except for the migraines. She continues to exercise. On pravastatin her most recent LDL was 85. She had a normal echo in 2009. We had planned a saline contrast study to look for PFO, but this was not yet done.  The patient specifically denies any chest pain at rest exertion, dyspnea at rest or with exertion, orthopnea, paroxysmal nocturnal dyspnea, syncope, palpitations, focal neurological deficits, intermittent claudication, lower extremity edema, unexplained weight gain, cough, hemoptysis or wheezing.  Past Medical History:  Diagnosis Date  . Anal fissure 2006  . Elevated cholesterol   . Endometrial polyp   . Headache   .  Sleep disturbance     Past Surgical History:  Procedure Laterality Date  . DILATION AND CURETTAGE OF UTERUS  2009   endometrial polyp  . DOPPLER ECHOCARDIOGRAPHY  04/27/2008   EF =>55%;lv fx normal  . WRIST SURGERY      Current Medications: Current Meds  Medication Sig  . Cholecalciferol 50 MCG (2000 UT) CAPS Take 2,000 Units by mouth daily.  . metoprolol succinate (TOPROL-XL) 50 MG 24 hr tablet Take 50 mg by mouth at bedtime.  . pravastatin (PRAVACHOL) 80 MG tablet Take 40 mg by mouth at bedtime.  . rizatriptan (MAXALT) 10 MG tablet Take 10 mg by mouth as needed for migraine.  . topiramate (TOPAMAX) 50 MG tablet Take 50 mg by mouth 2 (two) times daily.  . [DISCONTINUED] meloxicam (MOBIC) 15 MG tablet TAKE ONE TABLET BY MOUTH ONE TIME DAILY     Allergies:   Patient has no known allergies.   Social History   Socioeconomic History  . Marital status: Married    Spouse name: Not on file  . Number of children: Not on file  . Years of education: Not on file  . Highest education level: Not on file  Occupational History  . Not on file  Tobacco Use  . Smoking status: Former Games developer  . Smokeless tobacco: Never Used  Vaping Use  . Vaping Use: Never used  Substance and Sexual Activity  . Alcohol use: Yes    Comment: Rare  .  Drug use: No  . Sexual activity: Not Currently    Birth control/protection: Post-menopausal    Comment: Vasectomy, intercourse age 31, less than 5 sexual partners,des neg  Other Topics Concern  . Not on file  Social History Narrative   Caffeine 1 cup coffee daily.    Social Determinants of Health   Financial Resource Strain: Not on file  Food Insecurity: Not on file  Transportation Needs: Not on file  Physical Activity: Not on file  Stress: Not on file  Social Connections: Not on file     Family History: The patient's ] family history includes Cancer in her mother; Diabetes in her brother, mother, paternal grandfather, and paternal grandmother;  Heart disease in her father and paternal grandfather; Hypertension in her father, paternal grandfather, and sister. There is no history of Breast cancer.  ROS:   Please see the history of present illness.     All other systems reviewed and are negative.  EKGs/Labs/Other Studies Reviewed:    The following studies were reviewed today: ED notes, labs, CXR, ECG  EKG:  EKG is ordered today.  The ekg ordered today demonstrates NSR, vertical axis, normal.  Recent Labs: 03/19/2021: BUN 13; Creatinine, Ser 0.98; Hemoglobin 15.6; Magnesium 1.9; Platelets 210; Potassium 3.5; Sodium 138; TSH 2.203  Recent Lipid Panel    Component Value Date/Time   CHOL 162 09/14/2017 0823   TRIG 67 09/14/2017 0823   HDL 63 09/14/2017 0823   CHOLHDL 2.6 09/14/2017 0823   VLDL 16 10/07/2016 0902   LDLCALC 85 09/14/2017 0823     Risk Assessment/Calculations:       Physical Exam:    VS:  BP 132/68 (BP Location: Left Arm, Patient Position: Sitting, Cuff Size: Normal)   Pulse (!) 56   Ht 5' 3.25" (1.607 m)   Wt 174 lb (78.9 kg)   BMI 30.58 kg/m     Wt Readings from Last 3 Encounters:  03/26/21 174 lb (78.9 kg)  03/19/21 173 lb (78.5 kg)  10/12/17 168 lb (76.2 kg)     GEN:  Well nourished, well developed in no acute distress HEENT: Normal NECK: No JVD; No carotid bruits LYMPHATICS: No lymphadenopathy CARDIAC: RRR, no murmurs, rubs, gallops RESPIRATORY:  Clear to auscultation without rales, wheezing or rhonchi  ABDOMEN: Soft, non-tender, non-distended MUSCULOSKELETAL:  No edema; No deformity  SKIN: Warm and dry NEUROLOGIC:  Alert and oriented x 3 PSYCHIATRIC:  Normal affect   ASSESSMENT:    1. Sinus tachycardia   2. Migraine without status migrainosus, not intractable, unspecified migraine type   3. Hypercholesteremia    PLAN:    In order of problems listed above:  1. Sinus tachycardia: likely due to Topamax withdrawal. Resolved. Advised that rebound tachycardia can also occur with  metoprolol withdrawal. 2. Migraines: check saline contrast echo for PFO. 3. Hypercholesterolemia: on statin. No recent lipid profile.        Medication Adjustments/Labs and Tests Ordered: Current medicines are reviewed at length with the patient today.  Concerns regarding medicines are outlined above.  Orders Placed This Encounter  Procedures  . EKG 12-Lead   No orders of the defined types were placed in this encounter.   Patient Instructions  Medication Instructions:  No changes *If you need a refill on your cardiac medications before your next appointment, please call your pharmacy*   Lab Work: None needed If you have labs (blood work) drawn today and your tests are completely normal, you will receive your results only by: Marland Kitchen  MyChart Message (if you have MyChart) OR . A paper copy in the mail If you have any lab test that is abnormal or we need to change your treatment, we will call you to review the results.   Testing/Procedures: None ordered   Follow-Up: At [facility], you and your health needs are our priority.  As part of our continuing mission to provide you with exceptional heart care, we have created designated Provider Care Teams.  These Care Teams include your primary Cardiologist (physician) and Advanced Practice Providers (APPs -  Physician Assistants and Nurse Practitioners) who all work together to provide you with the care you need, when you need it.  We recommend signing up for the patient portal called "MyChart".  Sign up information is provided on this After Visit Summary.  MyChart is used to connect with patients for Virtual Visits (Telemedicine).  Patients are able to view lab/test results, encounter notes, upcoming appointments, etc.  Non-urgent messages can be sent to your provider as well.   To learn more about what you can do with MyChart, go to [email or web address].    Your next appointment:   Follow up as needed with Dr. Royann Shivers     Signed, Thurmon Fair, MD  03/29/2021 8:26 PM

## 2021-03-26 NOTE — Patient Instructions (Signed)
Medication Instructions:  No changes *If you need a refill on your cardiac medications before your next appointment, please call your pharmacy*   Lab Work: None needed If you have labs (blood work) drawn today and your tests are completely normal, you will receive your results only by: Marland Kitchen MyChart Message (if you have MyChart) OR . A paper copy in the mail If you have any lab test that is abnormal or we need to change your treatment, we will call you to review the results.   Testing/Procedures: None ordered   Follow-Up: At [facility], you and your health needs are our priority.  As part of our continuing mission to provide you with exceptional heart care, we have created designated Provider Care Teams.  These Care Teams include your primary Cardiologist (physician) and Advanced Practice Providers (APPs -  Physician Assistants and Nurse Practitioners) who all work together to provide you with the care you need, when you need it.  We recommend signing up for the patient portal called "MyChart".  Sign up information is provided on this After Visit Summary.  MyChart is used to connect with patients for Virtual Visits (Telemedicine).  Patients are able to view lab/test results, encounter notes, upcoming appointments, etc.  Non-urgent messages can be sent to your provider as well.   To learn more about what you can do with MyChart, go to [email or web address].    Your next appointment:   Follow up as needed with Dr. Sallyanne Kuster

## 2021-04-08 ENCOUNTER — Encounter: Payer: Self-pay | Admitting: Podiatry

## 2021-04-08 ENCOUNTER — Ambulatory Visit (INDEPENDENT_AMBULATORY_CARE_PROVIDER_SITE_OTHER): Payer: Managed Care, Other (non HMO) | Admitting: Podiatry

## 2021-04-08 ENCOUNTER — Other Ambulatory Visit: Payer: Self-pay

## 2021-04-08 DIAGNOSIS — Q828 Other specified congenital malformations of skin: Secondary | ICD-10-CM | POA: Diagnosis not present

## 2021-04-08 DIAGNOSIS — M6740 Ganglion, unspecified site: Secondary | ICD-10-CM

## 2021-04-08 DIAGNOSIS — K5904 Chronic idiopathic constipation: Secondary | ICD-10-CM | POA: Insufficient documentation

## 2021-04-08 DIAGNOSIS — D3610 Benign neoplasm of peripheral nerves and autonomic nervous system, unspecified: Secondary | ICD-10-CM | POA: Diagnosis not present

## 2021-04-08 DIAGNOSIS — Z1211 Encounter for screening for malignant neoplasm of colon: Secondary | ICD-10-CM | POA: Insufficient documentation

## 2021-04-08 MED ORDER — TRIAMCINOLONE ACETONIDE 10 MG/ML IJ SUSP
10.0000 mg | Freq: Once | INTRAMUSCULAR | Status: AC
  Administered 2021-04-08: 10 mg

## 2021-04-08 NOTE — Progress Notes (Signed)
Subjective: 60 year old female presents the office today for concerns of recurrent pain in the left foot.  She states the neuroma has started to come back causing discomfort over the last 6 weeks.  She states that she also has a callus on the [place] of her foot.  No recent injury or falls or any changes otherwise. Denies any systemic complaints such as fevers, chills, nausea, vomiting. No acute changes since last appointment, and no other complaints at this time.   Objective: AAO x3, NAD DP/PT pulses palpable bilaterally, CRT less than 3 seconds Tenderness palpation of the second interspace left foot and small palpable neuroma is identified.  There is no area of pinpoint tenderness.  Hyperkeratotic lesion submetatarsal 2 without any underlying ulceration drainage or any signs of infection.  On the dorsal aspect of foot is a fluid-filled soft tissue mass consistent ganglion cyst just medial to this is an area of exostosis.  No tenderness palpation of this areas.  No edema, erythema.  MMT 5/5. No pain with calf compression, swelling, warmth, erythema  Assessment: Left foot neuroma, hyperkeratotic lesion  Plan: -All treatment options discussed with the patient including all alternatives, risks, complications.  -Steroid injection was performed on the neuroma.  The skin was prepped with alcohol and mixture of 1 cc Kenalog 10, 0.5 cc of Marcaine plain, 0.5 cc of Marcaine plain was infiltrated into the area of tenderness in the interim without any complications.  Postinjection care discussed. -Dispensed neuroma pads.  If symptoms continue will make a custom orthotic with a neuroma pad -Sharply debrided the hyperkeratotic lesion as a courtesy with any complications or bleeding. -Cyst is asymptomatic.  Discussed with her possible aspiration if it becomes larger or causes issues. -Patient encouraged to call the office with any questions, concerns, change in symptoms.   Trula Slade DPM

## 2021-05-07 ENCOUNTER — Ambulatory Visit: Payer: PRIVATE HEALTH INSURANCE | Admitting: Cardiovascular Disease

## 2021-06-10 IMAGING — DX DG CHEST 1V PORT
1 series · 1 of 1 positions shown · non-contrast
Comparison: No prior.

CLINICAL DATA: Shortness of breath.

EXAM:
PORTABLE CHEST 1 VIEW

[chest]
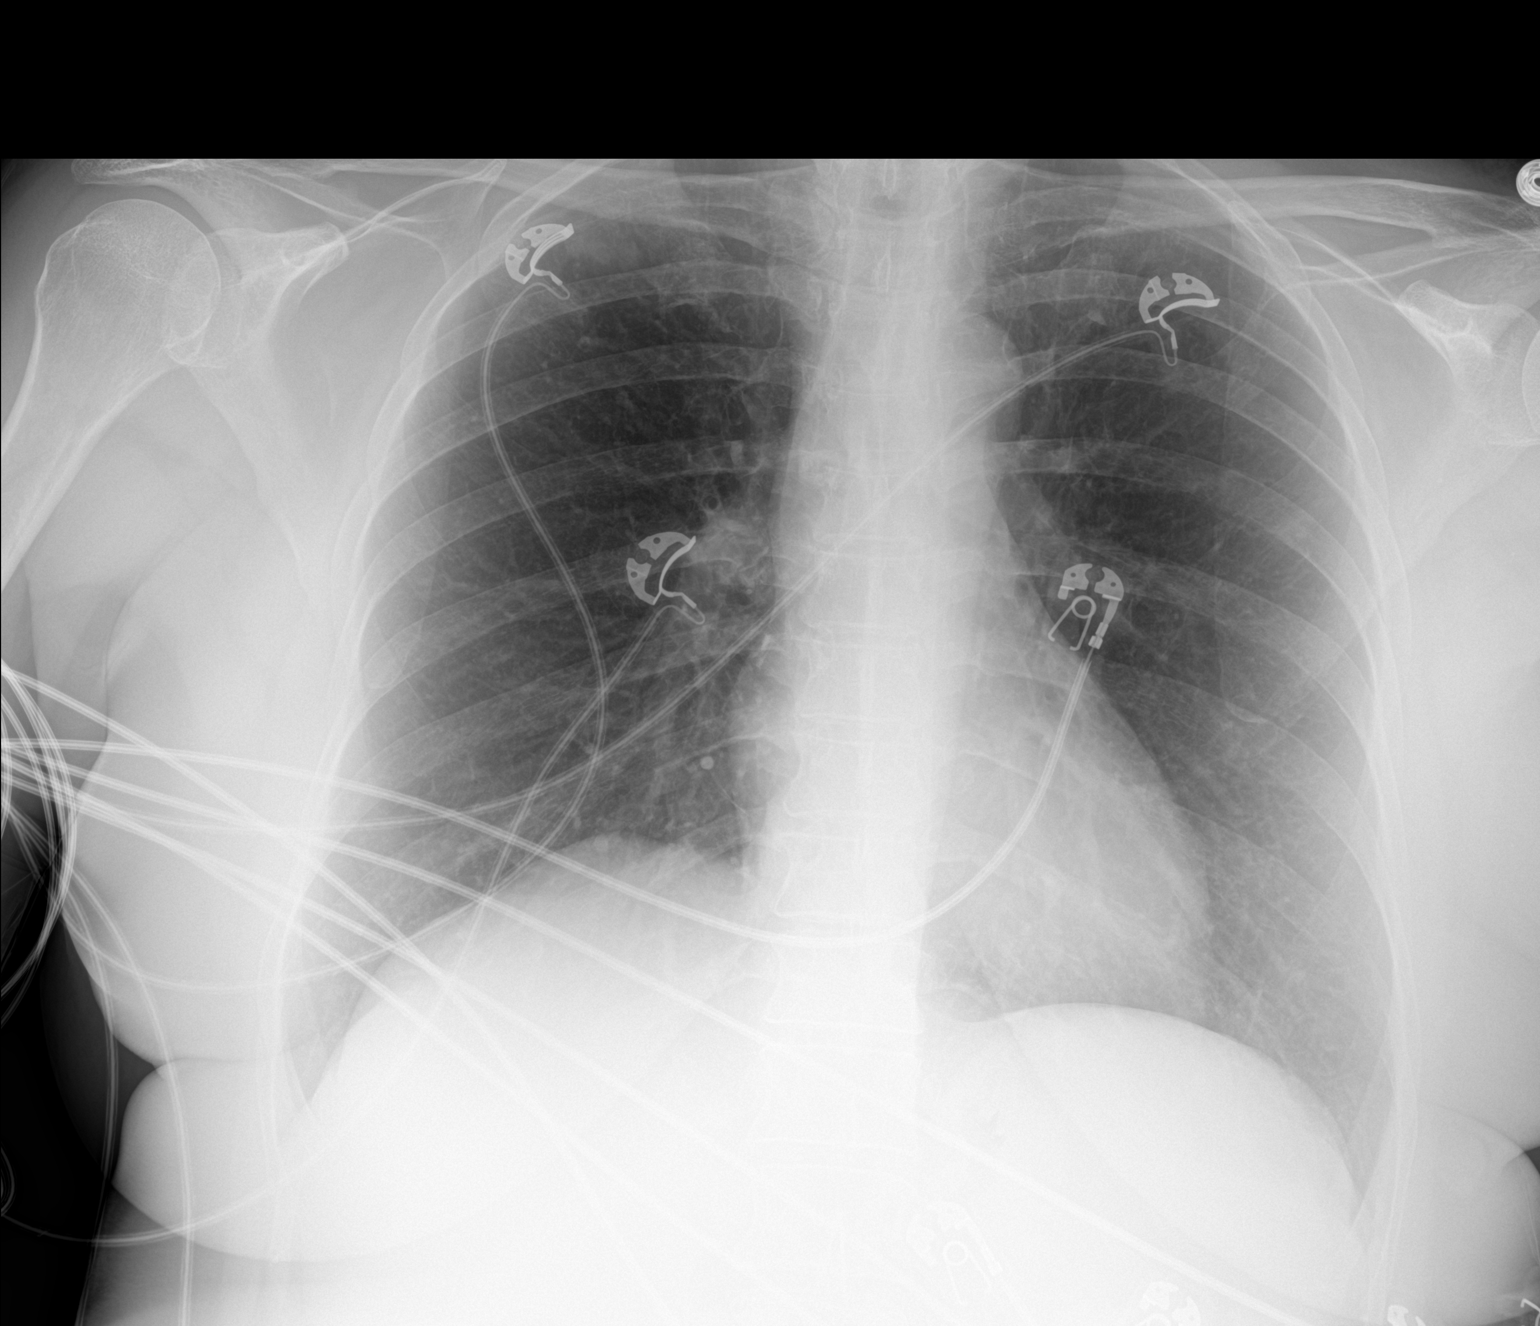

[1 of 1 positions shown; findings below may reference images not displayed]

FINDINGS: Mediastinum and hilar structures normal. Heart size normal. EKG
leads noted over the chest. Faint density over the right apex most
likely related to EKG lead. Mild atelectasis cannot be excluded. No
pleural effusion or pneumothorax. No acute bony abnormality.
IMPRESSION: EKG leads noted over the chest. Faint density of the right apex most
likely related the kg leads. Mild atelectasis cannot be excluded. No
acute cardiopulmonary disease otherwise noted.

## 2021-09-20 ENCOUNTER — Other Ambulatory Visit: Payer: Self-pay

## 2021-09-20 ENCOUNTER — Ambulatory Visit (INDEPENDENT_AMBULATORY_CARE_PROVIDER_SITE_OTHER): Payer: Managed Care, Other (non HMO) | Admitting: Podiatry

## 2021-09-20 DIAGNOSIS — D3610 Benign neoplasm of peripheral nerves and autonomic nervous system, unspecified: Secondary | ICD-10-CM

## 2021-09-20 MED ORDER — TRIAMCINOLONE ACETONIDE 10 MG/ML IJ SUSP
10.0000 mg | Freq: Once | INTRAMUSCULAR | Status: AC
  Administered 2021-09-20: 10 mg

## 2021-09-20 NOTE — Patient Instructions (Signed)

## 2021-09-25 ENCOUNTER — Ambulatory Visit
Admission: RE | Admit: 2021-09-25 | Discharge: 2021-09-25 | Disposition: A | Discharge status: Home / Self Care | Payer: Managed Care, Other (non HMO) | Source: Ambulatory Visit | Attending: Podiatry | Admitting: Podiatry

## 2021-09-25 DIAGNOSIS — D3610 Benign neoplasm of peripheral nerves and autonomic nervous system, unspecified: Secondary | ICD-10-CM

## 2021-09-25 NOTE — Progress Notes (Signed)
Subjective: 60 year old female presents the office today for concerns of recurrent pain on neuroma of the left second interspace.  She states that the last injection lasted for some time but it started to come back over the last 3 weeks.  She describes a sharp pain to the area which radiate into the toes.  No swelling.  No recent injury.  Pain is consistent.  Objective: AAO x3, NAD DP/PT pulses palpable bilaterally, CRT less than 3 seconds Majority tenderness today is localized along the second interspace of the left foot.  Small palpable neuroma versus bursa present.  Minimal discomfort on the MPJs.  There is no edema.  No area of pinpoint tenderness.  No pain with calf compression, swelling, warmth, erythema  Assessment: Neuroma likely left foot  Plan: -All treatment options discussed with the patient including all alternatives, risks, complications.  -Steroid injection performed today.  Skin was prepped with alcohol and mixture of 1 cc Kenalog 10, 0.5 cc of Marcaine plain, 0.5 cc of lidocaine plain was infiltrated on the left second interspace without complications.  Postinjection care discussed. -I have ordered ultrasound to further evaluate the area. -Patient encouraged to call the office with any questions, concerns, change in symptoms.   Trula Slade DPM

## 2021-11-04 NOTE — Progress Notes (Signed)
MARLEI GLOMSKI January 21, 1961 [id number]   History:  60 y.o. G2P2002 presents for annual exam. Postmenopausal - no HRT, no bleeding. Normal pap and mammogram history. History of migraine with aura, pre-diabetes, HDL, HTN managed by primary care.  Gynecologic History No LMP recorded. Patient is postmenopausal.   Contraception/Family planning: post menopausal status Sexually active: Yes  Health Maintenance Last Pap: 10/12/2017. Results were: Normal, 5-year repeat Last mammogram: 10/2018. Results were: Normal Last colonoscopy: 2019. Results were: polyps, 3-5 year recall Last Dexa: Never  Past medical history, past surgical history, family history and social history were all reviewed and documented in the EPIC chart. Married. Works at [employer]. Son lives in [place], Daughter in [place] with 41 yo and 19 yo.   ROS:  A ROS was performed and pertinent positives and negatives are included.  Exam:  Vitals:   11/05/21 0805  BP: 124/78  Weight: 169 lb (76.7 kg)  Height: 5\' 3"  (1.6 m)    Body mass index is 29.94 kg/m.  General appearance:  Normal Thyroid:  Symmetrical, normal in size, without palpable masses or nodularity. Respiratory  Auscultation:  Clear without wheezing or rhonchi Cardiovascular  Auscultation:  Regular rate, without rubs, murmurs or gallops  Edema/varicosities:  Not grossly evident Abdominal  Soft,nontender, without masses, guarding or rebound.  Liver/spleen:  No organomegaly noted  Hernia:  None appreciated  Skin  Inspection:  Grossly normal   Breasts: Examined lying and sitting.   Right: Without masses, retractions, discharge or axillary adenopathy.   Left: Without masses, retractions, discharge or axillary adenopathy. Genitourinary   Inguinal/mons:  Normal without inguinal adenopathy  External genitalia:  Normal appearing vulva with no masses, tenderness, or lesions  BUS/Urethra/Skene's glands:  Normal  Vagina:  Normal appearing with normal color  and discharge, no lesions. Atrophic changes  Cervix:  Normal appearing without discharge or lesions  Uterus:  Normal in size, shape and contour.  Midline and mobile, nontender  Adnexa/parametria:     Rt: Normal in size, without masses or tenderness.   Lt: Normal in size, without masses or tenderness.  Anus and perineum: Normal  Digital rectal exam: Normal sphincter tone without palpated masses or tenderness  Patient informed chaperone available to be present for breast and pelvic exam. Patient has requested no chaperone to be present. Patient has been advised what will be completed during breast and pelvic exam.   Assessment/Plan:  60 y.o. G2P2002 for annual exam.   Well female exam with routine gynecological exam - Education provided on SBEs, importance of preventative screenings, current guidelines, high calcium diet, regular exercise, and multivitamin daily. Labs done with PCP. Her PCP is out due to family emergency and patient is unsure if she will be back in January for her lab rechecks. If not, she will let me know and we will order them for her (A1C, lipid panel).   Postmenopausal - no HRT, no bleeding.   Screening for cervical cancer - Normal Pap history.  Will repeat at 5-year interval per guidelines.  Screening for breast cancer - Normal mammogram history.  Overdue for screening mammogram.  Discussed current guidelines and importance of preventative screenings.  She plans to schedule this soon.  Normal breast exam today.  Screening for colon cancer - Screening colonoscopy in 2019.  She will repeat in 3 to 5 years per GI's recommendation.  Screening for osteoporosis - average risk. Will plan for DXA at age 47.   Follow up in 1 year for annual.  Tamela Gammon [facility], 8:49 AM 11/05/2021

## 2021-11-05 ENCOUNTER — Other Ambulatory Visit: Payer: Self-pay

## 2021-11-05 ENCOUNTER — Ambulatory Visit (INDEPENDENT_AMBULATORY_CARE_PROVIDER_SITE_OTHER): Payer: Managed Care, Other (non HMO) | Admitting: Nurse Practitioner

## 2021-11-05 ENCOUNTER — Encounter: Payer: Self-pay | Admitting: Nurse Practitioner

## 2021-11-05 VITALS — BP 124/78 | Ht 63.0 in | Wt 169.0 lb

## 2021-11-05 DIAGNOSIS — Z780 Asymptomatic menopausal state: Secondary | ICD-10-CM

## 2021-11-05 DIAGNOSIS — Z01419 Encounter for gynecological examination (general) (routine) without abnormal findings: Secondary | ICD-10-CM

## 2021-11-12 ENCOUNTER — Other Ambulatory Visit: Payer: Self-pay | Admitting: Otolaryngology

## 2021-11-13 ENCOUNTER — Other Ambulatory Visit: Payer: Self-pay | Admitting: Otolaryngology

## 2021-11-13 DIAGNOSIS — H918X9 Other specified hearing loss, unspecified ear: Secondary | ICD-10-CM

## 2021-11-13 DIAGNOSIS — H9311 Tinnitus, right ear: Secondary | ICD-10-CM

## 2021-11-29 ENCOUNTER — Other Ambulatory Visit: Payer: Managed Care, Other (non HMO)

## 2021-12-11 ENCOUNTER — Encounter: Payer: Self-pay | Admitting: Nurse Practitioner

## 2021-12-11 ENCOUNTER — Other Ambulatory Visit: Payer: Self-pay | Admitting: Nurse Practitioner

## 2021-12-11 DIAGNOSIS — E559 Vitamin D deficiency, unspecified: Secondary | ICD-10-CM

## 2021-12-11 DIAGNOSIS — Z0000 Encounter for general adult medical examination without abnormal findings: Secondary | ICD-10-CM

## 2021-12-11 DIAGNOSIS — E785 Hyperlipidemia, unspecified: Secondary | ICD-10-CM

## 2021-12-12 ENCOUNTER — Other Ambulatory Visit: Payer: Managed Care, Other (non HMO)

## 2021-12-12 ENCOUNTER — Other Ambulatory Visit: Payer: Self-pay

## 2021-12-12 DIAGNOSIS — E785 Hyperlipidemia, unspecified: Secondary | ICD-10-CM

## 2021-12-12 DIAGNOSIS — Z0000 Encounter for general adult medical examination without abnormal findings: Secondary | ICD-10-CM

## 2021-12-12 DIAGNOSIS — E559 Vitamin D deficiency, unspecified: Secondary | ICD-10-CM

## 2021-12-13 ENCOUNTER — Other Ambulatory Visit: Payer: Managed Care, Other (non HMO)

## 2021-12-13 LAB — COMPREHENSIVE METABOLIC PANEL
AG Ratio: 2.1 (calc) (ref 1.0–2.5)
ALT: 17 U/L (ref 6–29)
AST: 16 U/L (ref 10–35)
Albumin: 4.4 g/dL (ref 3.6–5.1)
Alkaline phosphatase (APISO): 63 U/L (ref 37–153)
BUN: 17 mg/dL (ref 7–25)
CO2: 25 mmol/L (ref 20–32)
Calcium: 9.7 mg/dL (ref 8.6–10.4)
Chloride: 107 mmol/L (ref 98–110)
Creat: 0.9 mg/dL (ref 0.50–1.05)
Globulin: 2.1 g/dL (calc) (ref 1.9–3.7)
Glucose, Bld: 99 mg/dL (ref 65–99)
Potassium: 4.2 mmol/L (ref 3.5–5.3)
Sodium: 141 mmol/L (ref 135–146)
Total Bilirubin: 0.6 mg/dL (ref 0.2–1.2)
Total Protein: 6.5 g/dL (ref 6.1–8.1)

## 2021-12-13 LAB — CBC WITH DIFFERENTIAL/PLATELET
Absolute Monocytes: 525 cells/uL (ref 200–950)
Basophils Absolute: 43 cells/uL (ref 0–200)
Basophils Relative: 0.7 %
Eosinophils Absolute: 171 cells/uL (ref 15–500)
Eosinophils Relative: 2.8 %
HCT: 45.4 % — ABNORMAL HIGH (ref 35.0–45.0)
Hemoglobin: 15 g/dL (ref 11.7–15.5)
Lymphs Abs: 1769 cells/uL (ref 850–3900)
MCH: 29.4 pg (ref 27.0–33.0)
MCHC: 33 g/dL (ref 32.0–36.0)
MCV: 89 fL (ref 80.0–100.0)
MPV: 12.2 fL (ref 7.5–12.5)
Monocytes Relative: 8.6 %
Neutro Abs: 3593 cells/uL (ref 1500–7800)
Neutrophils Relative %: 58.9 %
Platelets: 220 10*3/uL (ref 140–400)
RBC: 5.1 10*6/uL (ref 3.80–5.10)
RDW: 12.5 % (ref 11.0–15.0)
Total Lymphocyte: 29 %
WBC: 6.1 10*3/uL (ref 3.8–10.8)

## 2021-12-13 LAB — LIPID PANEL
Cholesterol: 162 mg/dL (ref <200)
HDL: 52 mg/dL (ref >=50)
LDL Cholesterol (Calc): 91 mg/dL (calc)
Non-HDL Cholesterol (Calc): 110 mg/dL (calc) (ref <130)
Total CHOL/HDL Ratio: 3.1 (calc) (ref <5.0)
Triglycerides: 101 mg/dL (ref <150)

## 2021-12-13 LAB — VITAMIN D 25 HYDROXY (VIT D DEFICIENCY, FRACTURES): Vit D, 25-Hydroxy: 49 ng/mL (ref 30–100)

## 2021-12-17 IMAGING — US US EXTREM LOW*L* LIMITED
2 series · 9 of 9 positions shown · non-contrast
Comparison: Foot radiograph 07/06/2020.

CLINICAL DATA: neuorma; left foot

EXAM:
ULTRASOUND left LOWER EXTREMITY LIMITED
TECHNIQUE: Ultrasound examination of the lower extremity soft tissues was
performed in the area of clinical concern.

[Series 1: us extrem low*left* limited · 0.06mm/px · 5 acquisitions, 5 frames shown (1 of 2)]
[im 1/5]
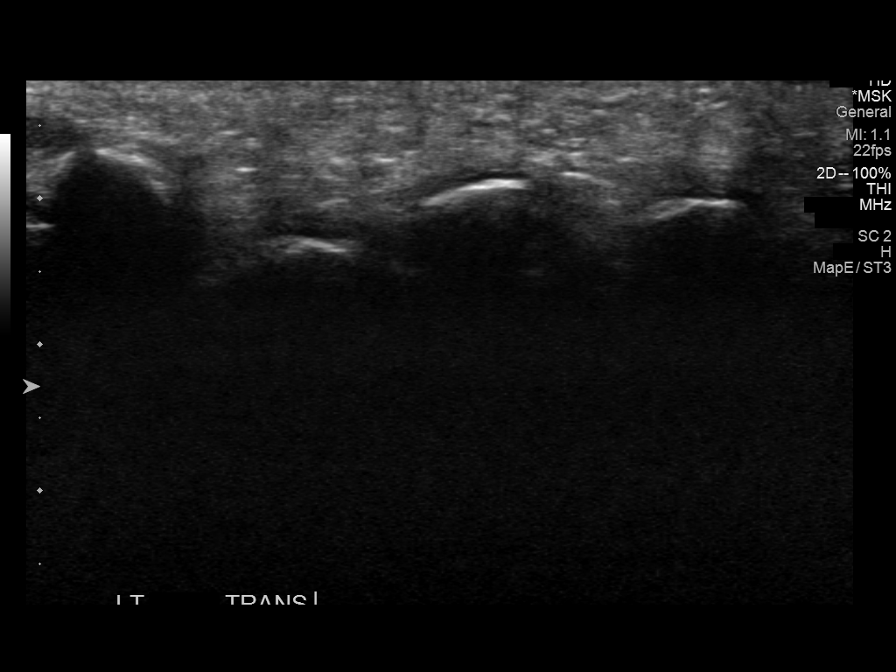
[im 2/5]
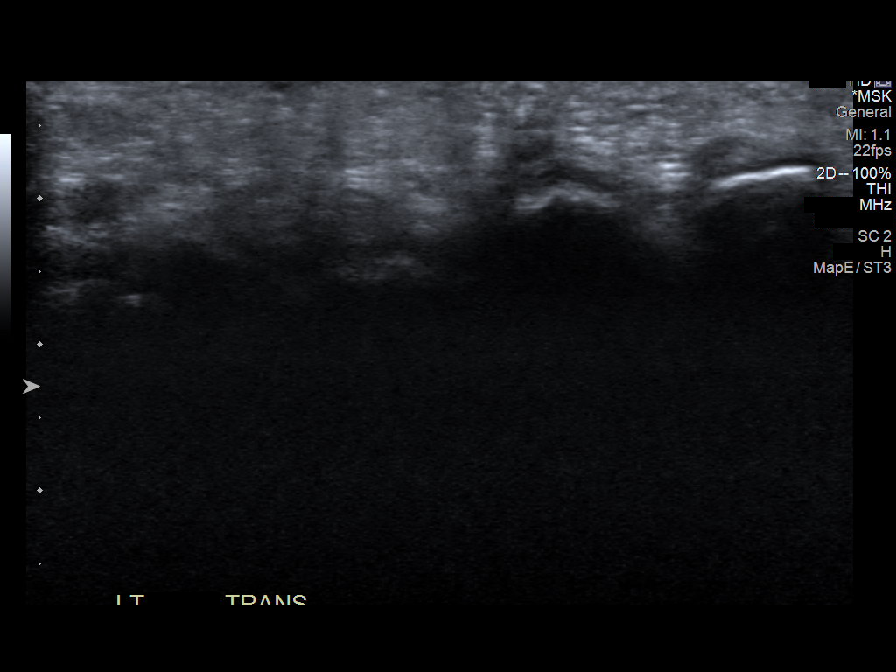
[im 3/5]
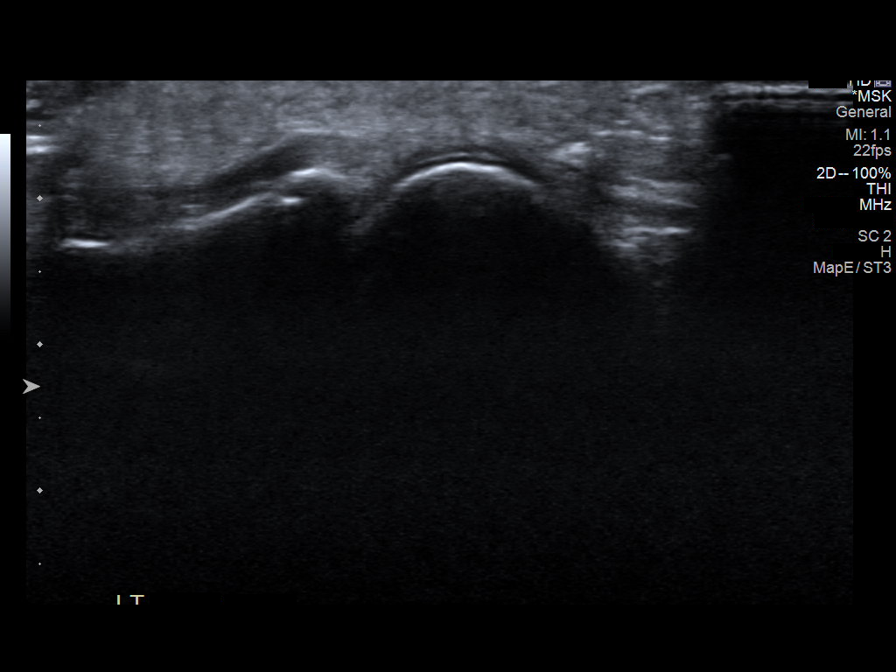
[im 4/5]
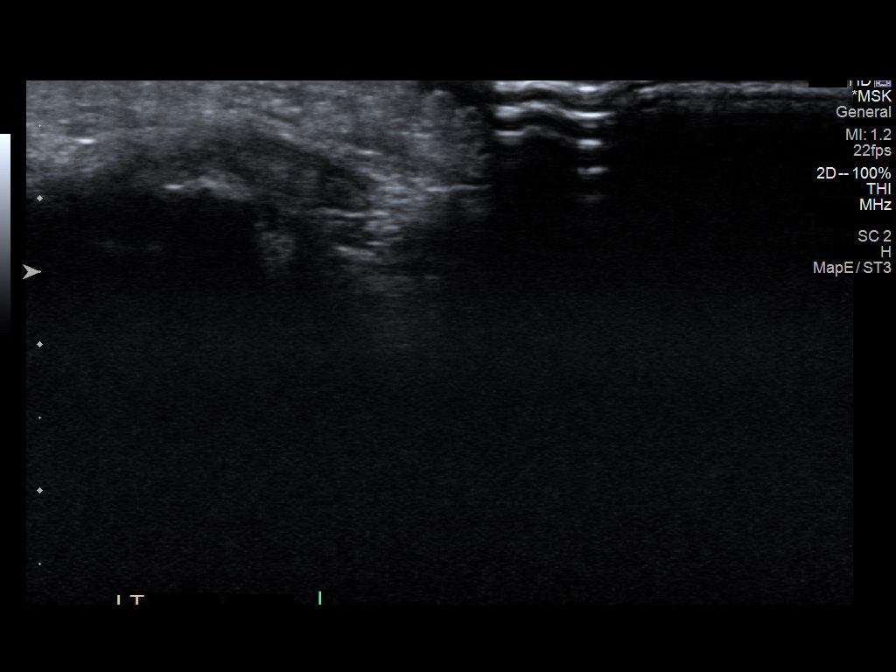
[im 5/5]
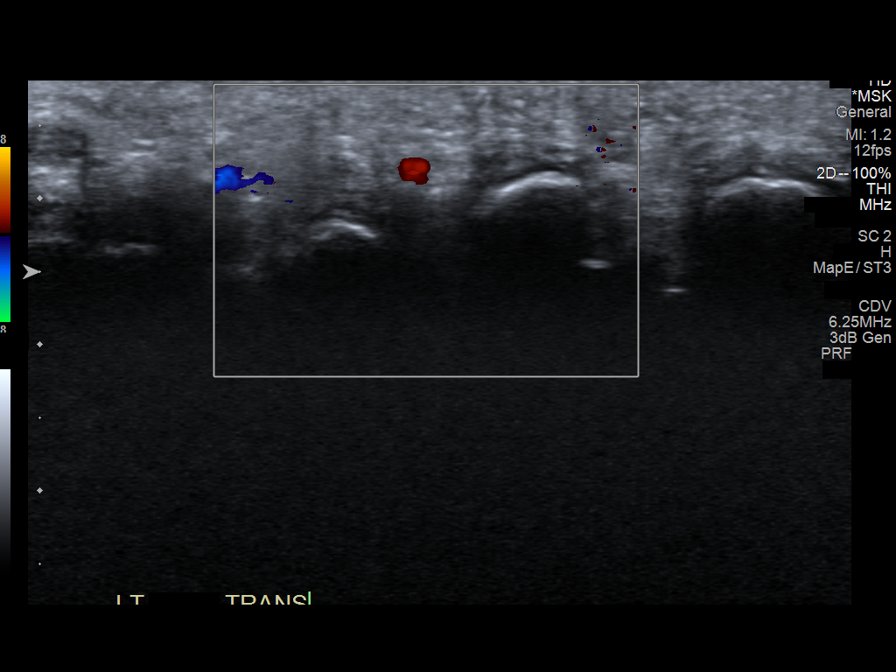

[Series 2: us extrem low*left* limited · 4 acquisitions, 4 frames shown (2 of 2)]
[im 1/4]
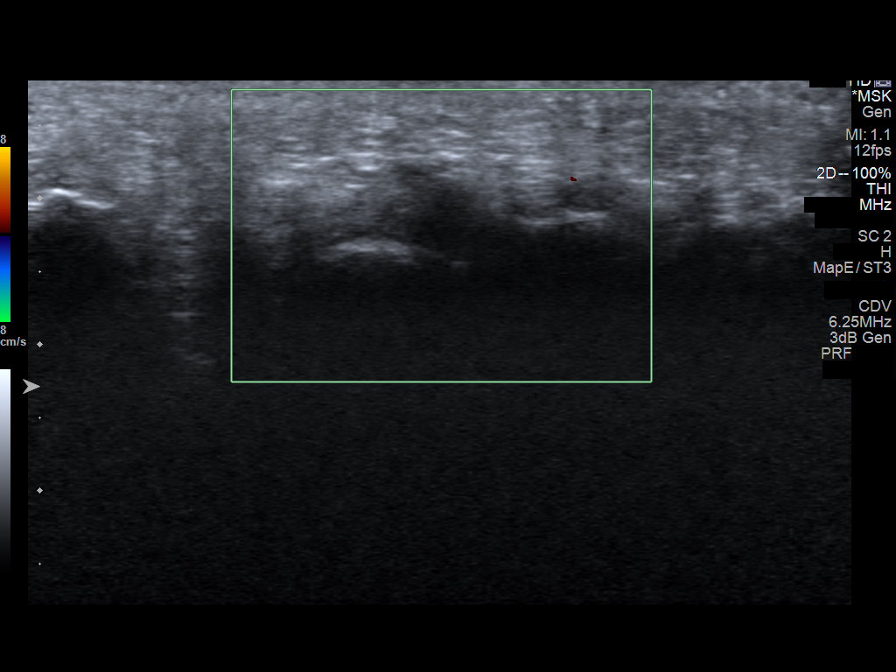
[im 2/4]
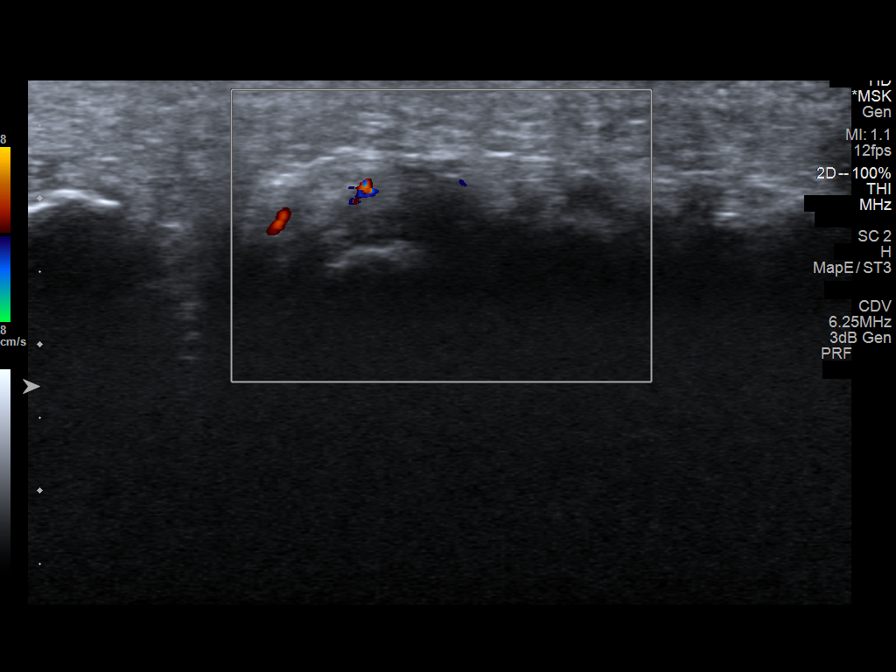
[im 3/4]
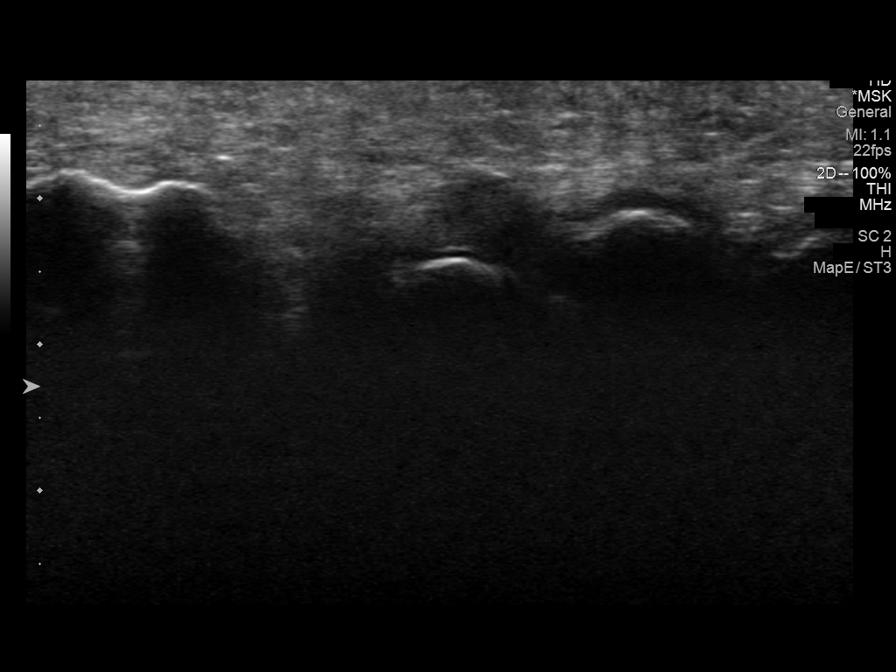
[im 4/4]
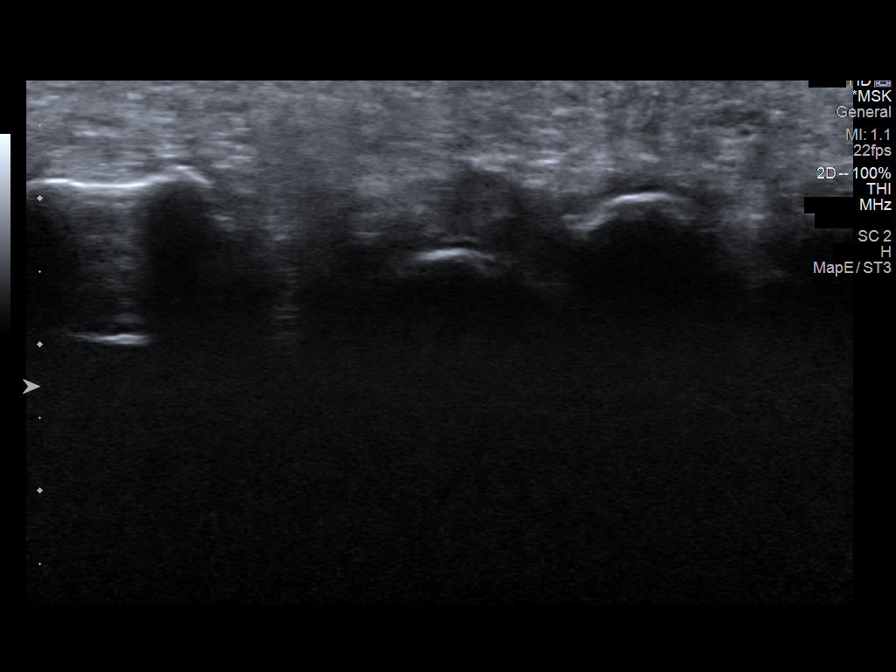

[9 of 9 positions shown; findings below may reference images not displayed]

FINDINGS: Within the plantar aspect of the forefoot, there is ill-defined
hypoechoic tissue between the second and third metatarsal heads.
There is bulging with squeezing of the forefoot, consistent with a
positive sonographic Sewekgaming sign. No significant vascularity. This
tissue measures approximately 1.0 by 0.8 cm.
IMPRESSION: Small neuroma between the second and third metatarsal heads.

## 2022-02-26 ENCOUNTER — Telehealth: Payer: Self-pay

## 2022-02-26 NOTE — Telephone Encounter (Signed)
No I agree with OV for evaluation. Thanks.  ?

## 2022-02-26 NOTE — Telephone Encounter (Signed)
Pt calling to report spot of brown on panty liner when using the restroom yesterday and also seemed as if urine was a darker color as if "old blood" was in urine as well. Denies any significant pain, "minimal bloating" if anything. Also reports excessive exercising prior so has supspicion from that as well. Denies any other urinary sx. Pt notified she will need an OV to eval. Voiced understanding. Anything to add?  ?

## 2022-02-26 NOTE — Telephone Encounter (Signed)
Pt notified a voiced understanding. Msg sent to scheduling.  ?

## 2022-02-27 ENCOUNTER — Encounter: Payer: Self-pay | Admitting: Radiology

## 2022-02-27 ENCOUNTER — Ambulatory Visit (INDEPENDENT_AMBULATORY_CARE_PROVIDER_SITE_OTHER): Payer: Managed Care, Other (non HMO) | Admitting: Radiology

## 2022-02-27 ENCOUNTER — Ambulatory Visit (INDEPENDENT_AMBULATORY_CARE_PROVIDER_SITE_OTHER): Payer: Managed Care, Other (non HMO) | Admitting: Podiatry

## 2022-02-27 ENCOUNTER — Other Ambulatory Visit: Payer: Self-pay

## 2022-02-27 VITALS — BP 138/76

## 2022-02-27 DIAGNOSIS — N952 Postmenopausal atrophic vaginitis: Secondary | ICD-10-CM | POA: Diagnosis not present

## 2022-02-27 DIAGNOSIS — D3610 Benign neoplasm of peripheral nerves and autonomic nervous system, unspecified: Secondary | ICD-10-CM

## 2022-02-27 DIAGNOSIS — R82998 Other abnormal findings in urine: Secondary | ICD-10-CM | POA: Diagnosis not present

## 2022-02-27 MED ORDER — MELOXICAM 15 MG PO TABS
15.0000 mg | ORAL_TABLET | Freq: Every day | ORAL | 2 refills | Status: DC | PRN
Start: 2022-02-27 — End: 2022-08-28

## 2022-02-27 MED ORDER — ESTRADIOL 10 MCG VA TABS: 1.0000 | ORAL_TABLET | VAGINAL | 1 refills | Status: DC

## 2022-02-27 NOTE — Telephone Encounter (Signed)
Seen 02/27/22.  ?

## 2022-02-27 NOTE — Progress Notes (Signed)
? ? ? ? ?  Subjective: Julie Lucero is a 61 y.o. female who complains of 2 light spots on panty liner, dark blood when voided (Tuesday) lower pelvic pressure/cramping has persisted, noticed after increased exercise. Began [place] the weekend before ? ? ?Review of Systems  ?Constitutional:  Positive for activity change.  ?Cardiovascular: Negative.   ?Gastrointestinal: Negative.   ?Endocrine: Negative.   ?Genitourinary:  Positive for vaginal bleeding.  ?Musculoskeletal: Negative.   ?Skin: Negative.   ?Psychiatric/Behavioral: Negative.     ? ?Objective: ? ?-Vulva: without lesions or discharge ?-Vagina: no lesions or discharge, moderate atrophy, no bleeding from cervix. 1 excoriated area, dime sized right vaginal wall ?-Cervix: no lesion or discharge, no CMT ?-Perineum: no lesions ?-Uterus: Mobile, non tender ?-Adnexa: no masses or tenderness ? ?Urine dipstick shows positive for leukocytes.  Micro exam: 6-10 WBC's per HPF.  ? ?Chaperone offered and declined. ? ?Assessment:/Plan:  ? ?1. Atrophy of vagina ?Reassured bleeding does appear to becoming from vaginal atrophy and not the uterus ?- Estradiol (YUVAFEM) 10 MCG TABS vaginal tablet; Place 1 tablet (10 mcg total) vaginally 2 (two) times a week.  Dispense: 24 tablet; Refill: 1 ? ?2. Dark urine ?Continue to monitor. RTO if sx return and persist ?- Urinalysis,Complete w/RFL Culture  ? ?  ? ? ? ?

## 2022-02-27 NOTE — Patient Instructions (Signed)
Morton Neuralgia °Morton neuralgia is foot pain that affects the Stoecker of the foot and the area near the toes. Morton neuralgia occurs when part of a nerve in the foot (digital nerve) is under too much pressure (compressed). When this happens over a long period of time, the nerve can thicken (neuroma) and cause pain. Pain usually occurs between the third and fourth toes.  °Morton neuralgia can come and go but may get worse over time. °What are the causes? °This condition is caused by doing the same things over and over with your foot, such as: °Activities such as running or jumping. °Wearing shoes that are too tight. °What increases the risk? °You may be at higher risk for Morton neuralgia if you: °Are female. °Wear high heels. °Wear shoes that are narrow or tight. °Do activities that repeatedly stretch your toes, such as: °Running. °Ballet. °Long-distance walking. °What are the signs or symptoms? °The first symptom of Morton neuralgia is pain that spreads from the [place]augh of the foot to the toes. It may feel like you are walking on a marble. Pain usually gets worse with walking and goes away at night. Other symptoms may include numbness and cramping of your toes. Both feet are equally affected, but rarely at the same time. °How is this diagnosed? °This condition is diagnosed based on your symptoms, your medical history, and a physical exam. Your health care provider may: °Squeeze your foot just behind your toe. °Ask you to move your toes to check for pain. °Ask about your physical activity level. °You also may have imaging tests, such as an X-ray, ultrasound, or MRI. °How is this treated? °Treatment depends on how severe your condition is and what causes it. Treatment may involve: °Wearing different shoes that are not too tight, are low-heeled, and provide good support. For some people, this is the only treatment needed. °Wearing an over-the-counter or custom supportive pad (orthotic) under the front of your  foot. °Getting injections of numbing medicine and anti-inflammatory medicine (steroid) in the nerve. °Having surgery to remove part of the thickened nerve. °Follow these instructions at home: °Managing pain, stiffness, and swelling ° °Massage your foot as needed. °Wear orthotics as told by your health care provider. °If directed, put ice on the painful area. To do this: °Put ice in a plastic bag. °Place a towel between your skin and the bag. °Leave the ice on for 20 minutes, 2-3 times a day. °Remove the ice if your skin turns bright red. This is very important. If you cannot feel pain, heat, or cold, you have a greater risk of damage to the area. °Raise (elevate) the injured area above the level of your heart while you are sitting or lying down. °Avoid activities that cause pain or make pain worse. If you play sports, ask your health care provider when it is safe for you to return to sports. °General instructions °Take over-the-counter and prescription medicines only as told by your health care provider. °For the time period you were told by your health care provider, do not drive or use machinery. °Wear shoes that: °Have soft soles. °Have a wide toe area. °Provide arch support. °Do not pinch or squeeze your feet. °Have room for your orthotics, if this applies. °Keep all follow-up visits. This is important. °Contact a health care provider if: °Your symptoms get worse or do not get better with treatment and home care. °Summary °Morton neuralgia is foot pain that affects the Lococo of the foot and the area   near the toes. Pain usually occurs between the third and fourth toes, gets worse with walking, and goes away at night. °Morton neuralgia occurs when part of a nerve in the foot (digital nerve) is under too much pressure. When this happens over a long period of time, the nerve can thicken (neuroma) and cause pain. °This condition is caused by doing the same things over and over with your foot, such as running or  jumping, wearing shoes that are too tight, or wearing high heels. °Treatment may involve wearing low-heeled shoes that are not too tight, wearing a supportive pad (orthotic) under the front of your foot, getting injections in the nerve, or having surgery to remove part of the thickened nerve. °This information is not intended to replace advice given to you by your health care provider. Make sure you discuss any questions you have with your health care provider. °Document Revised: 05/10/2021 Document Reviewed: 05/10/2021 °Elsevier Patient Education © 2022 [employer] ° °

## 2022-03-01 LAB — URINE CULTURE
MICRO NUMBER:: 13139688
SPECIMEN QUALITY:: ADEQUATE

## 2022-03-01 LAB — URINALYSIS, COMPLETE W/RFL CULTURE
Bilirubin Urine: NEGATIVE
Glucose, UA: NEGATIVE
Hgb urine dipstick: NEGATIVE
Hyaline Cast: NONE SEEN /LPF
Ketones, ur: NEGATIVE
Nitrites, Initial: NEGATIVE
Protein, ur: NEGATIVE
RBC / HPF: NONE SEEN /HPF (ref 0–2)
Specific Gravity, Urine: 1.02 (ref 1.001–1.035)
pH: 6 (ref 5.0–8.0)

## 2022-03-01 LAB — CULTURE INDICATED

## 2022-03-03 NOTE — Progress Notes (Signed)
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  She states that it started to flareup and she has been exercising.  She started doing Pure Barre and she does not wear shoes when doing this activity.  She does get pain at other times as well.  No recent injuries.  No increase in swelling. ? ?Objective: ?AAO x3, NAD ?DP/PT pulses palpable bilaterally, CRT less than 3 seconds ?There is tenderness to palpation along the second x-rays of the left foot.  I am not able to palpate a significant neuroma.  There is no edema.  No pain with imaging range of motion.  No area of point tenderness.  No pain with calf compression, swelling, warmth, erythema ? ?Assessment: ?Likely recurrence of neuroma ? ?Plan: ?-All treatment options discussed with the patient including all alternatives, risks, complications.  ?-We discussed steroid injection but ultimately held off on this today.  Prescription wants to take as needed.  Recommended metatarsal pads that ever she is not on it is dispensed a gel metatarsal pad that she can use when exercising to help decrease pressure. ?-Patient encouraged to call the office with any questions, concerns, change in symptoms.  ? ?Julie Lucero DPM ? ?

## 2022-03-19 ENCOUNTER — Telehealth: Payer: Self-pay | Admitting: Podiatry

## 2022-03-19 NOTE — Telephone Encounter (Signed)
Pt sent message thru email and I called pt she said you told her that if her foot started hurting to just stop by the office and get the injection as at the last appt it was not needed. She states she has been in pain for about a week and half now. Please advise as your next available appt would not be until 4.14.2023 ?

## 2022-03-20 ENCOUNTER — Ambulatory Visit (INDEPENDENT_AMBULATORY_CARE_PROVIDER_SITE_OTHER): Payer: Managed Care, Other (non HMO) | Admitting: Podiatry

## 2022-03-20 ENCOUNTER — Ambulatory Visit (INDEPENDENT_AMBULATORY_CARE_PROVIDER_SITE_OTHER): Payer: Managed Care, Other (non HMO)

## 2022-03-20 DIAGNOSIS — R2242 Localized swelling, mass and lump, left lower limb: Secondary | ICD-10-CM

## 2022-03-20 DIAGNOSIS — D3610 Benign neoplasm of peripheral nerves and autonomic nervous system, unspecified: Secondary | ICD-10-CM

## 2022-03-20 DIAGNOSIS — M79672 Pain in left foot: Secondary | ICD-10-CM

## 2022-03-20 DIAGNOSIS — D3613 Benign neoplasm of peripheral nerves and autonomic nervous system of lower limb, including hip: Secondary | ICD-10-CM | POA: Diagnosis not present

## 2022-03-20 MED ORDER — TRIAMCINOLONE ACETONIDE 10 MG/ML IJ SUSP
10.0000 mg | Freq: Once | INTRAMUSCULAR | Status: AC
  Administered 2022-03-20: 10 mg

## 2022-03-20 NOTE — Telephone Encounter (Signed)
Called pt and she said 52 today would be great and thank you.Marland Kitchen She has been added to the schedule. ?

## 2022-03-20 NOTE — Patient Instructions (Signed)

## 2022-03-22 NOTE — Progress Notes (Signed)
Subjective: ?61 year old female presents the office today for concerns of worsening pain over the last week for left foot and she is requesting steroid injection.  She has noticed some minimal swelling.  No injuries or falls or any changes in activity.  She has pain going into the second and third toes. ? ?Objective: ?AAO x3, NAD ?DP/PT pulses palpable bilaterally, CRT less than 3 seconds ?Tenderness along the second interspace.  She does describe nerve symptoms into the second and third toes.  There is trace edema.  No open lesions.  No area pinpoint tenderness there is no pain with vibratory sensation over the metatarsals or toes.  No pain with calf compression, swelling, warmth, erythema ? ?Assessment: ?Left foot neuroma ? ?Plan: ?-All treatment options discussed with the patient including all alternatives, risks, complications.  ?-Given the swelling as well as worsening pain I did repeat the x-rays today.  3 views of the left foot were obtained.  No evidence of acute fracture. ?-We discussed both conservative as well as surgical treatment options for the neuroma.  Also decided to proceed with injection today.  Skin was prepped with alcohol and a mixture of 1 cc Kenalog 10, 0.5 cc of Marcaine plain, 0.5 cc of lidocaine plain was infiltrated into the second interspace on the area of maximal tenderness without complications.  Postinjection care discussed.  Tolerated well. ?-Continue orthotic with neuroma pad. ?-Discussed other treatment options including physical therapy, dehydrated sclerosing alcohol injection, surgical intervention. ?-Patient encouraged to call the office with any questions, concerns, change in symptoms.  ? ?Trula Slade DPM ? ?

## 2022-07-26 ENCOUNTER — Other Ambulatory Visit: Payer: Self-pay | Admitting: Radiology

## 2022-07-26 DIAGNOSIS — N952 Postmenopausal atrophic vaginitis: Secondary | ICD-10-CM

## 2022-07-28 NOTE — Telephone Encounter (Signed)
Last annual exam was 08/2021 Scheduled on 11/11/22 with Tiffany

## 2022-08-19 ENCOUNTER — Ambulatory Visit: Payer: Managed Care, Other (non HMO) | Admitting: Podiatry

## 2022-08-19 ENCOUNTER — Ambulatory Visit (INDEPENDENT_AMBULATORY_CARE_PROVIDER_SITE_OTHER): Payer: Managed Care, Other (non HMO)

## 2022-08-19 DIAGNOSIS — M778 Other enthesopathies, not elsewhere classified: Secondary | ICD-10-CM

## 2022-08-19 DIAGNOSIS — D3613 Benign neoplasm of peripheral nerves and autonomic nervous system of lower limb, including hip: Secondary | ICD-10-CM | POA: Diagnosis not present

## 2022-08-19 DIAGNOSIS — M2062 Acquired deformities of toe(s), unspecified, left foot: Secondary | ICD-10-CM

## 2022-08-19 DIAGNOSIS — D3610 Benign neoplasm of peripheral nerves and autonomic nervous system, unspecified: Secondary | ICD-10-CM | POA: Diagnosis not present

## 2022-08-20 ENCOUNTER — Telehealth: Payer: Self-pay | Admitting: Podiatry

## 2022-08-20 ENCOUNTER — Encounter: Payer: Self-pay | Admitting: Podiatry

## 2022-08-20 MED ORDER — METHYLPREDNISOLONE 4 MG PO TBPK: ORAL_TABLET | ORAL | 0 refills | Status: DC

## 2022-08-20 NOTE — Telephone Encounter (Signed)
Called patient and let her know medication was called in

## 2022-08-20 NOTE — Telephone Encounter (Signed)
Pharmacy : [facility]  Medication :  patient states that a prednisone pack was to be called into the pharmacy for her , from her visit yesterday and nothing has been received at the pharmacy.

## 2022-08-26 NOTE — Progress Notes (Signed)
Subjective: Chief Complaint  Patient presents with   Neuroma    Rm 12  Left foot follow up for neuroma. Pt states her 2nd and 3rd toe had constant numbness now with edema. Pt states symptoms came back 1 month ago. Pt also noticed separation between her 2nd and 3rd toe.     61 year old female presents the office today for the above concerns.  She said that she is getting constant numbness in her second and third toes as well as some swelling.  Symptoms recurred about a month ago.  She noticed separation between her second and third toes as well.  No injuries.  Objective: AAO x3, NAD DP/PT pulses palpable bilaterally, CRT less than 3 seconds Tenderness along the second interspace.  Tenderness on the second toe itself and some mild swelling to the toe there is no erythema or warmth.  There is medial deviation of the second toe.  Tenderness on second BJ.   Assessment: Left foot neuroma, capsulitis with toe deformity  Plan: -All treatment options discussed with the patient including all alternatives, risks, complications.  -Patient obtained reviewed of the left foot.  3 views were obtained.  No evidence of acute fracture.  Digital forming of the second digit.  No evidence of acute fracture. -Today she is having redness symptoms is also due to the digital deformity, capsulitis of the MPJ.  We discussed with conservative as well as surgical treatment options today.  Conservative discussed.  Offloading pads were dispensed, shoe modifications anti-inflammatories.  I prescribed a Medrol Dosepak.  We will continue with conservative care for now but if needed consider surgical intervention which we discussed today. -Patient encouraged to call the office with any questions, concerns, change in symptoms.   Trula Slade DPM

## 2022-08-28 ENCOUNTER — Other Ambulatory Visit: Payer: Self-pay

## 2022-08-28 ENCOUNTER — Emergency Department (HOSPITAL_BASED_OUTPATIENT_CLINIC_OR_DEPARTMENT_OTHER): Payer: Managed Care, Other (non HMO)

## 2022-08-28 ENCOUNTER — Encounter (HOSPITAL_BASED_OUTPATIENT_CLINIC_OR_DEPARTMENT_OTHER): Payer: Self-pay | Admitting: Emergency Medicine

## 2022-08-28 ENCOUNTER — Emergency Department (HOSPITAL_BASED_OUTPATIENT_CLINIC_OR_DEPARTMENT_OTHER)
Admission: EM | Admit: 2022-08-28 | Discharge: 2022-08-28 | Disposition: A | Discharge status: Home / Self Care | Payer: Managed Care, Other (non HMO) | Attending: Emergency Medicine | Admitting: Emergency Medicine

## 2022-08-28 DIAGNOSIS — D72829 Elevated white blood cell count, unspecified: Secondary | ICD-10-CM | POA: Insufficient documentation

## 2022-08-28 DIAGNOSIS — R1032 Left lower quadrant pain: Secondary | ICD-10-CM | POA: Diagnosis present

## 2022-08-28 DIAGNOSIS — E876 Hypokalemia: Secondary | ICD-10-CM | POA: Insufficient documentation

## 2022-08-28 DIAGNOSIS — N132 Hydronephrosis with renal and ureteral calculous obstruction: Secondary | ICD-10-CM | POA: Diagnosis not present

## 2022-08-28 DIAGNOSIS — N200 Calculus of kidney: Secondary | ICD-10-CM

## 2022-08-28 HISTORY — DX: Migraine, unspecified, not intractable, without status migrainosus: G43.909

## 2022-08-28 LAB — URINALYSIS, ROUTINE W REFLEX MICROSCOPIC
Bilirubin Urine: NEGATIVE
Glucose, UA: NEGATIVE mg/dL
Ketones, ur: NEGATIVE mg/dL
Leukocytes,Ua: NEGATIVE
Nitrite: NEGATIVE
Protein, ur: NEGATIVE mg/dL
Specific Gravity, Urine: >=1.03 (ref 1.005–1.030)
pH: 5 (ref 5.0–8.0)

## 2022-08-28 LAB — COMPREHENSIVE METABOLIC PANEL
ALT: 22 U/L (ref 0–44)
AST: 20 U/L (ref 15–41)
Albumin: 4.1 g/dL (ref 3.5–5.0)
Alkaline Phosphatase: 67 U/L (ref 38–126)
Anion gap: 9 (ref 5–15)
BUN: 27 mg/dL — ABNORMAL HIGH (ref 8–23)
CO2: 26 mmol/L (ref 22–32)
Calcium: 9.6 mg/dL (ref 8.9–10.3)
Chloride: 103 mmol/L (ref 98–111)
Creatinine, Ser: 1.14 mg/dL — ABNORMAL HIGH (ref 0.44–1.00)
GFR, Estimated: 54 mL/min — ABNORMAL LOW (ref >60)
Glucose, Bld: 111 mg/dL — ABNORMAL HIGH (ref 70–99)
Potassium: 3.3 mmol/L — ABNORMAL LOW (ref 3.5–5.1)
Sodium: 138 mmol/L (ref 135–145)
Total Bilirubin: 0.6 mg/dL (ref 0.3–1.2)
Total Protein: 6.9 g/dL (ref 6.5–8.1)

## 2022-08-28 LAB — URINALYSIS, MICROSCOPIC (REFLEX)

## 2022-08-28 LAB — CBC
HCT: 43.6 % (ref 36.0–46.0)
Hemoglobin: 14.3 g/dL (ref 12.0–15.0)
MCH: 29.7 pg (ref 26.0–34.0)
MCHC: 32.8 g/dL (ref 30.0–36.0)
MCV: 90.6 fL (ref 80.0–100.0)
Platelets: 220 10*3/uL (ref 150–400)
RBC: 4.81 MIL/uL (ref 3.87–5.11)
RDW: 13.4 % (ref 11.5–15.5)
WBC: 12.7 10*3/uL — ABNORMAL HIGH (ref 4.0–10.5)
nRBC: 0 % (ref 0.0–0.2)

## 2022-08-28 LAB — LIPASE, BLOOD: Lipase: 38 U/L (ref 11–51)

## 2022-08-28 MED ORDER — IBUPROFEN 600 MG PO TABS: 600.0000 mg | ORAL_TABLET | Freq: Four times a day (QID) | ORAL | 0 refills | Status: DC | PRN

## 2022-08-28 MED ORDER — TAMSULOSIN HCL 0.4 MG PO CAPS: 0.4000 mg | ORAL_CAPSULE | Freq: Every day | ORAL | 0 refills | Status: DC

## 2022-08-28 MED ORDER — HYDROCODONE-ACETAMINOPHEN 5-325 MG PO TABS: 2.0000 | ORAL_TABLET | ORAL | 0 refills | Status: DC | PRN

## 2022-08-28 MED ORDER — POTASSIUM CHLORIDE CRYS ER 20 MEQ PO TBCR
40.0000 meq | EXTENDED_RELEASE_TABLET | Freq: Once | ORAL | Status: AC
  Administered 2022-08-28: 40 meq via ORAL
  Filled 2022-08-28: qty 2

## 2022-08-28 MED ORDER — KETOROLAC TROMETHAMINE 15 MG/ML IJ SOLN
15.0000 mg | Freq: Once | INTRAMUSCULAR | Status: AC
  Administered 2022-08-28: 15 mg via INTRAVENOUS

## 2022-08-28 MED ORDER — KETOROLAC TROMETHAMINE 30 MG/ML IJ SOLN
15.0000 mg | Freq: Once | INTRAMUSCULAR | Status: DC
  Filled 2022-08-28: qty 1

## 2022-08-28 NOTE — ED Provider Notes (Signed)
[facility] EMERGENCY DEPARTMENT Provider Note   CSN: [id number] Arrival date & time: 08/28/22  1535     History  Chief Complaint  Patient presents with   Abdominal Pain    Julie Lucero is a 61 y.o. female.   Abdominal Pain   61 year old female presents emergency department with complaints of left lower quadrant abdominal pain.  Patient states symptoms began approximate around 2 AM this morning.  She states that symptoms have been persistent since onset and increasing in intensity.  She now states that pain is radiating to her left lower back/flank.  She also notes feelings of urinary retention as well as noticeable hematuria.  Denies history of kidney stones in the past.  Denies fever, chills, night sweats, chest pain, shortness of breath, nausea, vomiting, dysuria, vaginal symptoms, change in bowel habits.  Past medical history significant for migraine, headache, hypercholesterolemia  Home Medications Prior to Admission medications   Medication Sig Start Date End Date Taking? Authorizing Provider  HYDROcodone-acetaminophen (NORCO/VICODIN) 5-325 MG tablet Take 2 tablets by mouth every 4 (four) hours as needed for severe pain. 08/28/22  Yes Dion Saucier A, PA  ibuprofen (ADVIL) 600 MG tablet Take 1 tablet (600 mg total) by mouth every 6 (six) hours as needed. 08/28/22  Yes Dion Saucier A, PA  tamsulosin (FLOMAX) 0.4 MG CAPS capsule Take 1 capsule (0.4 mg total) by mouth daily. 08/28/22  Yes Wilnette Kales, PA  Cholecalciferol 50 MCG (2000 UT) CAPS Take 2,000 Units by mouth daily.    [provider]  Estradiol 10 MCG TABS vaginal tablet INSERT ONE TABLET VAGINALLY TWICE WEEKLY 07/29/22   Chrzanowski, Wende Crease B, NP  methylPREDNISolone (MEDROL DOSEPAK) 4 MG TBPK tablet Take as directed 08/20/22   Trula Slade, DPM  metoprolol succinate (TOPROL-XL) 50 MG 24 hr tablet Take by mouth. 07/14/22   [provider]  Multiple Vitamin (MULTIVITAMIN) capsule Take  1 capsule by mouth daily.    [provider]  pravastatin (PRAVACHOL) 80 MG tablet Take 1 tablet by mouth at bedtime. 07/14/22   [provider]  rizatriptan (MAXALT) 10 MG tablet Take 10 mg by mouth as needed for migraine. 12/25/20   [provider]  topiramate (TOPAMAX) 50 MG tablet Take 50 mg by mouth 2 (two) times daily. 01/31/21   [provider]      Allergies    Morphine and Percocet [oxycodone-acetaminophen]    Review of Systems   Review of Systems  Gastrointestinal:  Positive for abdominal pain.  All other systems reviewed and are negative.   Physical Exam Updated Vital Signs BP 134/79 (BP Location: Left Arm)   Pulse (!) 59   Temp 97.9 F (36.6 C) (Oral)   Resp 16   Ht '5\' 4"'$  (1.626 m)   Wt 72.6 kg   SpO2 98%   BMI 27.46 kg/m  Physical Exam Vitals and nursing note reviewed.  Constitutional:      General: She is not in acute distress.    Appearance: She is well-developed.  HENT:     Head: Normocephalic and atraumatic.  Eyes:     Conjunctiva/sclera: Conjunctivae normal.  Cardiovascular:     Rate and Rhythm: Normal rate and regular rhythm.     Heart sounds: No murmur heard. Pulmonary:     Effort: Pulmonary effort is normal. No respiratory distress.     Breath sounds: Normal breath sounds.  Abdominal:     Palpations: Abdomen is soft.     Tenderness:  There is no abdominal tenderness. There is left CVA tenderness. There is no right CVA tenderness.  Musculoskeletal:        General: No swelling.     Cervical back: Neck supple.  Skin:    General: Skin is warm and dry.     Capillary Refill: Capillary refill takes less than 2 seconds.  Neurological:     Mental Status: She is alert.  Psychiatric:        Mood and Affect: Mood normal.     ED Results / Procedures / Treatments   Labs (all labs ordered are listed, but only abnormal results are displayed) Labs Reviewed  COMPREHENSIVE METABOLIC PANEL - Abnormal; Notable for the  following components:      Result Value   Potassium 3.3 (*)    Glucose, Bld 111 (*)    BUN 27 (*)    Creatinine, Ser 1.14 (*)    GFR, Estimated 54 (*)    All other components within normal limits  CBC - Abnormal; Notable for the following components:   WBC 12.7 (*)    All other components within normal limits  URINALYSIS, ROUTINE W REFLEX MICROSCOPIC - Abnormal; Notable for the following components:   Hgb urine dipstick LARGE (*)    All other components within normal limits  URINALYSIS, MICROSCOPIC (REFLEX) - Abnormal; Notable for the following components:   Bacteria, UA FEW (*)    All other components within normal limits  URINE CULTURE  LIPASE, BLOOD    EKG None  Radiology CT Renal Stone Study  Result Date: 08/28/2022 CLINICAL DATA:  Left-sided abdominal pain concern for nephrolithiasis. EXAM: CT ABDOMEN AND PELVIS WITHOUT CONTRAST TECHNIQUE: Multidetector CT imaging of the abdomen and pelvis was performed following the standard protocol without IV contrast. RADIATION DOSE REDUCTION: This exam was performed according to the departmental dose-optimization program which includes automated exposure control, adjustment of the mA and/or kV according to patient size and/or use of iterative reconstruction technique. COMPARISON:  None Available. 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 Hepatobiliary: Unremarkable noncontrast enhanced appearance of the hepatic parenchyma. Gallbladder is distended without wall thickening or pericholecystic fluid. No biliary ductal dilation. Pancreas: No pancreatic ductal dilation or evidence of acute inflammation. Spleen: No splenomegaly. Adrenals/Urinary Tract: Bilateral adrenal glands appear normal. Edematous left kidney with  perinephric/periureteric stranding and hydroureteronephrosis to a 3 mm stone at the ureterovesicular junction. Numerous additional bilateral nonobstructive renal stones measure up to 5 mm. Urinary bladder is nondistended limiting evaluation. Stomach/Bowel: Stomach is unremarkable for degree of distension. No pathologically dilation of small or large bowel. Probable tiny appendicoliths layering within a noninflamed appendix. No evidence of acute bowel inflammation. Vascular/Lymphatic: Aortic atherosclerosis. No pathologically enlarged abdominal or pelvic lymph nodes. Reproductive: Uterus and bilateral adnexa are unremarkable. Other: Left retroperitoneal perinephric stranding. No walled off fluid collections. No pneumoperitoneum. Musculoskeletal: No acute or significant osseous findings. IMPRESSION: 1. Left obstructive uropathy with hydronephrosis to a 3 mm stone at the ureterovesicular junction. Suggest correlation with laboratory values to exclude superimposed infection. 2. Numerous additional bilateral nonobstructive renal calculi measure up to 5 mm. 3. Probable tiny appendicoliths layering within a noninflamed appendix. 4. Gallbladder is distended without wall thickening or pericholecystic fluid, suggest correlation with right upper quadrant tenderness to palpation. 5. Sigmoid colonic diverticulosis without findings of acute diverticulitis. 6.  Aortic Atherosclerosis (ICD10-I70.0). Electronically Signed   By:  Dahlia Bailiff M.D.   On: 08/28/2022 17:56    Procedures Procedures    Medications Ordered in ED Medications  ketorolac (TORADOL) 15 MG/ML injection 15 mg (15 mg Intravenous Given 08/28/22 1648)  potassium chloride SA (KLOR-CON M) CR tablet 40 mEq (40 mEq Oral Given 08/28/22 1653)    ED Course/ Medical Decision Making/ A&P                           Medical Decision Making Amount and/or Complexity of Data Reviewed Labs: ordered. Radiology: ordered.  Risk Prescription drug  management.   This patient presents to the ED for concern of abdominal pain, this involves an extensive number of treatment options, and is a complaint that carries with it a high risk of complications and morbidity.  The differential diagnosis includes diverticulitis, ovarian torsion, nephrolithiasis, small bowel obstruction, volvulus   Co morbidities that complicate the patient evaluation  See HPI   Additional history obtained:  Additional history obtained from EMR External records from outside source obtained and reviewed including prior renal function from 02/27/2022   Lab Tests:  I Ordered, and personally interpreted labs.  The pertinent results include: UA significant for large hemoglobin and 21-50 RBC.  Evidence of few bacteria without evidence of nitrite or leukocytes so will not treat for UTI given lack of associated symptoms of fever.  Mild leukocytosis of 12.7 likely reactive due to patient's pain.  No evidence of anemia.  Platelets within normal range.  Electrolytes significant for hypokalemia with a potassium of 3.3 which supplemented orally with 40 equivalents of potassium.  Mild AKI likely secondary to obstructive nephropathy given known nephrolithiasis with GFR 54, BUN of 27 and creatinine 1.14.  No transaminitis noted.  Otherwise no electrolyte abnormalities noticed.   Imaging Studies ordered:  I ordered imaging studies including CT renal stone study I independently visualized and interpreted imaging which showed left obstructive uropathy with hydronephrosis of 3 mm stone at the ureteral vesicular junction.  Numerous additional bilateral nonobstructive renal calculi.  Tiny appendicolith layering out with a noninflamed appendix.  Gallbladder distention without pericholecystic fluid; patient nontender in right upper quadrant so further imaging deemed unnecessary at this time.  Sigmoid colonic diverticulosis without findings of acute diverticulitis.  Aortic atherosclerosis. I  agree with the radiologist interpretation  Cardiac Monitoring: / EKG:  The patient was maintained on a cardiac monitor.  I personally viewed and interpreted the cardiac monitored which showed an underlying rhythm of: Sinus rhythm   Consultations Obtained:  Discussed the case with attending Dr. Billy Fischer who reviewed the patient's HPI and workup and was in agreement with plan   Problem List / ED Course / Critical interventions / Medication management  Nephrolithiasis I ordered medication including Toradol for pain   Reevaluation of the patient after these medicines showed that the patient improved I have reviewed the patients home medicines and have made adjustments as needed   Social Determinants of Health:  Denies tobacco, illicit drug use   Test / Admission - Considered:  Nephrolithiasis Vitals signs within normal range and stable throughout visit. Laboratory/imaging studies significant for: See above Patient symptoms likely secondary to obstructive kidney stone as indicated above.  No evidence of urinary tract infection associated and patient has remained afebrile since symptom onset; no indication for antibiotics at this time.  Patient recommended close follow-up outpatient with urology regardless of symptom duration given evidence of multiple additional renal calculi.  Treatment will be given  of Flomax, ibuprofen as well as Norco as needed for breakthrough pain.  Treatment plan discussed with patient she knowledge understanding was agreeable to said plan. Worrisome signs and symptoms were discussed with the patient, and the patient acknowledged understanding to return to the ED if noticed. Patient was stable upon discharge.         Final Clinical Impression(s) / ED Diagnoses Final diagnoses:  Nephrolithiasis    Rx / DC Orders ED Discharge Orders          Ordered    tamsulosin (FLOMAX) 0.4 MG CAPS capsule  Daily        08/28/22 1828    HYDROcodone-acetaminophen  (NORCO/VICODIN) 5-325 MG tablet  Every 4 hours PRN        08/28/22 1828    ibuprofen (ADVIL) 600 MG tablet  Every 6 hours PRN        08/28/22 1828              Wilnette Kales, PA 08/28/22 [place]    Gareth Morgan, MD 08/29/22 1318

## 2022-08-28 NOTE — Discharge Instructions (Addendum)
Note the work-up today was overall consistent for left-sided kidney stone as we discussed.  We will treat this with Flomax to take daily while your symptoms are present.  I will also prescribe ibuprofen to take as needed for pain/inflammation as well as Norco/Vicodin to take as needed for breakthrough pain.  Recommend close follow-up with urology regardless of how long your symptoms last given additional kidney stone seen on CT imaging.  Please do not hesitate to return to the emergency department for worrisome signs symptoms we discussed become apparent.

## 2022-08-28 NOTE — ED Triage Notes (Addendum)
Left lower abd pain since early this am,  radiates to back , pain is consistent  feels pressure in bladder area, no n/v/d  no vag d/c, no fever

## 2022-08-29 LAB — URINE CULTURE: Culture: <10000 — AB

## 2022-09-10 ENCOUNTER — Other Ambulatory Visit: Payer: Self-pay | Admitting: Podiatry

## 2022-09-10 DIAGNOSIS — R2242 Localized swelling, mass and lump, left lower limb: Secondary | ICD-10-CM

## 2022-09-30 ENCOUNTER — Ambulatory Visit: Payer: Managed Care, Other (non HMO) | Admitting: Podiatry

## 2022-10-09 ENCOUNTER — Other Ambulatory Visit: Payer: Self-pay | Admitting: Orthopedic Surgery

## 2022-10-09 DIAGNOSIS — M7742 Metatarsalgia, left foot: Secondary | ICD-10-CM

## 2022-10-09 DIAGNOSIS — G5762 Lesion of plantar nerve, left lower limb: Secondary | ICD-10-CM

## 2022-10-18 ENCOUNTER — Ambulatory Visit
Admission: RE | Admit: 2022-10-18 | Discharge: 2022-10-18 | Disposition: A | Discharge status: Home / Self Care | Payer: Managed Care, Other (non HMO) | Source: Ambulatory Visit | Attending: Orthopedic Surgery

## 2022-10-18 DIAGNOSIS — M7742 Metatarsalgia, left foot: Secondary | ICD-10-CM

## 2022-10-18 DIAGNOSIS — G5762 Lesion of plantar nerve, left lower limb: Secondary | ICD-10-CM

## 2022-11-10 ENCOUNTER — Other Ambulatory Visit: Payer: Self-pay | Admitting: Nurse Practitioner

## 2022-11-10 DIAGNOSIS — Z1231 Encounter for screening mammogram for malignant neoplasm of breast: Secondary | ICD-10-CM

## 2022-11-10 NOTE — Progress Notes (Deleted)
   Julie Lucero 09/23/1961 [id number]   History:  61 y.o. G2P2002 presents for annual exam. Postmenopausal - no HRT, no bleeding. Started on [place] in March for vaginal atrophy. Normal pap and mammogram history. History of migraine with aura, pre-diabetes, HDL, HTN managed by primary care.  Gynecologic History No LMP recorded. Patient is postmenopausal.   Contraception/Family planning: post menopausal status Sexually active: Yes  Health Maintenance Last Pap: 10/12/2017. Results were: Normal neg HPV Last mammogram: 10/2018. Results were: Normal Last colonoscopy: 12/21/2017. Results were: polyps, 3-5 year recall Last Dexa: Never  Past medical history, past surgical history, family history and social history were all reviewed and documented in the EPIC chart. Married. Works at [employer]. Son lives in [place], Daughter in [place] with 58 yo and 88 yo.   ROS:  A ROS was performed and pertinent positives and negatives are included.  Exam:  There were no vitals filed for this visit.   There is no height or weight on file to calculate BMI.  General appearance:  Normal Thyroid:  Symmetrical, normal in size, without palpable masses or nodularity. Respiratory  Auscultation:  Clear without wheezing or rhonchi Cardiovascular  Auscultation:  Regular rate, without rubs, murmurs or gallops  Edema/varicosities:  Not grossly evident Abdominal  Soft,nontender, without masses, guarding or rebound.  Liver/spleen:  No organomegaly noted  Hernia:  None appreciated  Skin  Inspection:  Grossly normal   Breasts: Examined lying and sitting.   Right: Without masses, retractions, discharge or axillary adenopathy.   Left: Without masses, retractions, discharge or axillary adenopathy. Genitourinary   Inguinal/mons:  Normal without inguinal adenopathy  External genitalia:  Normal appearing vulva with no masses, tenderness, or lesions  BUS/Urethra/Skene's glands:  Normal  Vagina:  Normal  appearing with normal color and discharge, no lesions. Atrophic changes  Cervix:  Normal appearing without discharge or lesions  Uterus:  Normal in size, shape and contour.  Midline and mobile, nontender  Adnexa/parametria:     Rt: Normal in size, without masses or tenderness.   Lt: Normal in size, without masses or tenderness.  Anus and perineum: Normal  Digital rectal exam: Normal sphincter tone without palpated masses or tenderness  Patient informed chaperone available to be present for breast and pelvic exam. Patient has requested no chaperone to be present. Patient has been advised what will be completed during breast and pelvic exam.   Assessment/Plan:  61 y.o. G2P2002 for annual exam.   Well female exam with routine gynecological exam - Education provided on SBEs, importance of preventative screenings, current guidelines, high calcium diet, regular exercise, and multivitamin daily. Labs done with PCP.  Postmenopausal - no HRT, no bleeding.   Screening for cervical cancer - Normal Pap history.  Pap today.   Screening for breast cancer - Normal mammogram history.  Overdue for screening mammogram.  Discussed current guidelines and importance of preventative screenings.  She plans to schedule this soon.  Normal breast exam today.  Screening for colon cancer - Screening colonoscopy in 2019.  She will repeat in 3 to 5 years per GI's recommendation.  Screening for osteoporosis - average risk. Will plan for DXA at age 39.   Follow up in 1 year for annual.     Tamela Gammon [facility], 1:37 PM 11/10/2022

## 2022-11-11 ENCOUNTER — Ambulatory Visit: Payer: Managed Care, Other (non HMO) | Admitting: Nurse Practitioner

## 2022-11-11 DIAGNOSIS — Z780 Asymptomatic menopausal state: Secondary | ICD-10-CM

## 2022-11-11 DIAGNOSIS — Z01419 Encounter for gynecological examination (general) (routine) without abnormal findings: Secondary | ICD-10-CM

## 2022-11-11 DIAGNOSIS — N952 Postmenopausal atrophic vaginitis: Secondary | ICD-10-CM

## 2023-01-22 ENCOUNTER — Other Ambulatory Visit: Payer: Self-pay | Admitting: Nurse Practitioner

## 2023-01-22 ENCOUNTER — Telehealth: Payer: Self-pay

## 2023-01-22 DIAGNOSIS — E559 Vitamin D deficiency, unspecified: Secondary | ICD-10-CM

## 2023-01-22 DIAGNOSIS — Z0000 Encounter for general adult medical examination without abnormal findings: Secondary | ICD-10-CM

## 2023-01-22 DIAGNOSIS — E785 Hyperlipidemia, unspecified: Secondary | ICD-10-CM

## 2023-01-22 NOTE — Telephone Encounter (Signed)
Patient has her AEX scheduled for 02/19/23. She said she usually comes the week prior and has her labs done fasting.  She would like orders placed and lab appt. Please advise orders.

## 2023-01-22 NOTE — Telephone Encounter (Signed)
Orders placed. Thanks.

## 2023-01-22 NOTE — Telephone Encounter (Signed)
I called patient and informed her. She said there is a [facility] lab next to where she works and she wants to have them there. I told her [facility] placed orders in Epic. She said she will check with that Lab to see if they can see those orders. If not she will get their fax # and we will fax order there.

## 2023-02-19 ENCOUNTER — Ambulatory Visit: Payer: Managed Care, Other (non HMO) | Admitting: Nurse Practitioner

## 2023-03-03 ENCOUNTER — Encounter: Payer: Self-pay | Admitting: Nurse Practitioner

## 2023-03-03 DIAGNOSIS — E559 Vitamin D deficiency, unspecified: Secondary | ICD-10-CM

## 2023-03-03 DIAGNOSIS — E785 Hyperlipidemia, unspecified: Secondary | ICD-10-CM

## 2023-03-03 DIAGNOSIS — Z0000 Encounter for general adult medical examination without abnormal findings: Secondary | ICD-10-CM

## 2023-03-03 NOTE — Telephone Encounter (Signed)
New orders placed and released.  Orders faxed to patient at number provided.   Routing to provider for final review.  Will close encounter.

## 2023-03-05 ENCOUNTER — Encounter: Payer: Self-pay | Admitting: Nurse Practitioner

## 2023-03-05 LAB — COMPREHENSIVE METABOLIC PANEL
AG Ratio: 2.2 (calc) (ref 1.0–2.5)
ALT: 18 U/L (ref 6–29)
AST: 13 U/L (ref 10–35)
Albumin: 4.6 g/dL (ref 3.6–5.1)
Alkaline phosphatase (APISO): 60 U/L (ref 37–153)
BUN: 18 mg/dL (ref 7–25)
CO2: 24 mmol/L (ref 20–32)
Calcium: 10.1 mg/dL (ref 8.6–10.4)
Chloride: 104 mmol/L (ref 98–110)
Creat: 0.66 mg/dL (ref 0.50–1.05)
Globulin: 2.1 g/dL (calc) (ref 1.9–3.7)
Glucose, Bld: 103 mg/dL — ABNORMAL HIGH (ref 65–99)
Potassium: 4.4 mmol/L (ref 3.5–5.3)
Sodium: 138 mmol/L (ref 135–146)
Total Bilirubin: 0.4 mg/dL (ref 0.2–1.2)
Total Protein: 6.7 g/dL (ref 6.1–8.1)

## 2023-03-05 LAB — CBC WITH DIFFERENTIAL/PLATELET
Absolute Monocytes: 559 cells/uL (ref 200–950)
Basophils Absolute: 62 cells/uL (ref 0–200)
Basophils Relative: 0.9 %
Eosinophils Absolute: 200 cells/uL (ref 15–500)
Eosinophils Relative: 2.9 %
HCT: 42.2 % (ref 35.0–45.0)
Hemoglobin: 14 g/dL (ref 11.7–15.5)
Lymphs Abs: 2353 cells/uL (ref 850–3900)
MCH: 29.9 pg (ref 27.0–33.0)
MCHC: 33.2 g/dL (ref 32.0–36.0)
MCV: 90 fL (ref 80.0–100.0)
MPV: 12.7 fL — ABNORMAL HIGH (ref 7.5–12.5)
Monocytes Relative: 8.1 %
Neutro Abs: 3726 cells/uL (ref 1500–7800)
Neutrophils Relative %: 54 %
Platelets: 231 10*3/uL (ref 140–400)
RBC: 4.69 10*6/uL (ref 3.80–5.10)
RDW: 12.7 % (ref 11.0–15.0)
Total Lymphocyte: 34.1 %
WBC: 6.9 10*3/uL (ref 3.8–10.8)

## 2023-03-05 LAB — LIPID PANEL
Cholesterol: 171 mg/dL (ref <200)
HDL: 66 mg/dL (ref >=50)
LDL Cholesterol (Calc): 86 mg/dL (calc)
Non-HDL Cholesterol (Calc): 105 mg/dL (calc) (ref <130)
Total CHOL/HDL Ratio: 2.6 (calc) (ref <5.0)
Triglycerides: 101 mg/dL (ref <150)

## 2023-03-05 LAB — VITAMIN D 25 HYDROXY (VIT D DEFICIENCY, FRACTURES): Vit D, 25-Hydroxy: 40 ng/mL (ref 30–100)

## 2023-03-10 ENCOUNTER — Ambulatory Visit: Payer: Managed Care, Other (non HMO) | Admitting: Nurse Practitioner

## 2023-04-13 ENCOUNTER — Encounter: Payer: Self-pay | Admitting: Nurse Practitioner

## 2023-04-13 ENCOUNTER — Ambulatory Visit (INDEPENDENT_AMBULATORY_CARE_PROVIDER_SITE_OTHER): Payer: Managed Care, Other (non HMO) | Admitting: Nurse Practitioner

## 2023-04-13 ENCOUNTER — Other Ambulatory Visit (HOSPITAL_COMMUNITY)
Admission: RE | Admit: 2023-04-13 | Discharge: 2023-04-13 | Disposition: A | Discharge status: Home / Self Care | Payer: Managed Care, Other (non HMO) | Source: Ambulatory Visit | Attending: Nurse Practitioner | Admitting: Nurse Practitioner

## 2023-04-13 VITALS — BP 126/68 | HR 68 | Ht 63.25 in

## 2023-04-13 DIAGNOSIS — Z01419 Encounter for gynecological examination (general) (routine) without abnormal findings: Secondary | ICD-10-CM | POA: Diagnosis not present

## 2023-04-13 DIAGNOSIS — N952 Postmenopausal atrophic vaginitis: Secondary | ICD-10-CM

## 2023-04-13 DIAGNOSIS — Z124 Encounter for screening for malignant neoplasm of cervix: Secondary | ICD-10-CM | POA: Insufficient documentation

## 2023-04-13 DIAGNOSIS — Z833 Family history of diabetes mellitus: Secondary | ICD-10-CM

## 2023-04-13 DIAGNOSIS — R7301 Impaired fasting glucose: Secondary | ICD-10-CM

## 2023-04-13 DIAGNOSIS — Z780 Asymptomatic menopausal state: Secondary | ICD-10-CM

## 2023-04-13 MED ORDER — ESTRADIOL 10 MCG VA TABS: 1.0000 | ORAL_TABLET | VAGINAL | 3 refills | Status: DC

## 2023-04-13 NOTE — Progress Notes (Signed)
Julie Lucero 1960-12-29 [id number]   History:  62 y.o. G2P2002 presents for annual exam. Postmenopausal - no HRT, no bleeding. Started vaginal estrogen 07/2022 for atrophy but ran out in December. Would like to restart. Normal pap and mammogram history. History of migraine with aura, pre-diabetes, HDL, HTN managed by primary care. Labs completed in February.   Gynecologic History No LMP recorded. Patient is postmenopausal.   Contraception/Family planning: post menopausal status Sexually active: Yes  Health Maintenance Last Pap: 10/12/2017. Results were: Normal, 5-year repeat Last mammogram: 10/22/2018. Results were: Normal Last colonoscopy: 12/21/2017. Results were: Polyps, 3-5 year recall Last Dexa: Never  Past medical history, past surgical history, family history and social history were all reviewed and documented in the EPIC chart. Married. Works at [employer]. Son lives in [place], Daughter in [place] with 4 yo son and 71 yo daughter.   ROS:  A ROS was performed and pertinent positives and negatives are included.  Exam:  Vitals:   04/13/23 1332  BP: 126/68  Pulse: 68  SpO2: 100%  Height: 5' 3.25" (1.607 m)     Body mass index is 28.12 kg/m.  General appearance:  Normal Thyroid:  Symmetrical, normal in size, without palpable masses or nodularity. Respiratory  Auscultation:  Clear without wheezing or rhonchi Cardiovascular  Auscultation:  Regular rate, without rubs, murmurs or gallops  Edema/varicosities:  Not grossly evident Abdominal  Soft,nontender, without masses, guarding or rebound.  Liver/spleen:  No organomegaly noted  Hernia:  None appreciated  Skin  Inspection:  Grossly normal   Breasts: Examined lying and sitting.   Right: Without masses, retractions, discharge or axillary adenopathy.   Left: Without masses, retractions, discharge or axillary adenopathy. Genitourinary   Inguinal/mons:  Normal without inguinal adenopathy  External genitalia:   Normal appearing vulva with no masses, tenderness, or lesions  BUS/Urethra/Skene's glands:  Normal  Vagina:  Normal appearing with normal color and discharge, no lesions. Atrophic changes  Cervix:  Normal appearing without discharge or lesions  Uterus:  Normal in size, shape and contour.  Midline and mobile, nontender  Adnexa/parametria:     Rt: Normal in size, without masses or tenderness.   Lt: Normal in size, without masses or tenderness.  Anus and perineum: Normal  Digital rectal exam: Deferred  Patient informed chaperone available to be present for breast and pelvic exam. Patient has requested no chaperone to be present. Patient has been advised what will be completed during breast and pelvic exam.   Assessment/Plan:  62 y.o. G2P2002 for annual exam.   Well female exam with routine gynecological exam - Education provided on SBEs, importance of preventative screenings, current guidelines, high calcium diet, regular exercise, and multivitamin daily. Labs reviewed from February.   Postmenopausal - no HRT, no bleeding.   Screening for cervical cancer - Plan: Cytology - PAP( [facility]). Normal pap history.   Atrophy of vagina - Plan: Estradiol 10 MCG TABS vaginal tablet twice weekly. Started in August 2023 but ran out in December. Would like to restart.   Family history of diabetes mellitus - Plan: Hemoglobin A1c  Elevated fasting glucose - Plan: Hemoglobin A1c  Screening for breast cancer - Normal mammogram history.  Overdue for screening mammogram.  Discussed current guidelines and importance of preventative screenings. Normal breast exam today.  Screening for colon cancer - Screening colonoscopy in 2019.  Due now and encouraged to schedule.   Screening for osteoporosis - Average risk. Will plan for DXA at age 62.  Follow up in 1 year for annual.     Olivia Mackie [facility], 2:16 PM 04/13/2023

## 2023-04-14 LAB — HEMOGLOBIN A1C
Hgb A1c MFr Bld: 6.2 % of total Hgb — ABNORMAL HIGH (ref <5.7)
Mean Plasma Glucose: 131 mg/dL
eAG (mmol/L): 7.3 mmol/L

## 2023-04-15 LAB — CYTOLOGY - PAP
Comment: NEGATIVE
Diagnosis: NEGATIVE
High risk HPV: NEGATIVE

## 2023-07-13 ENCOUNTER — Other Ambulatory Visit: Payer: Self-pay

## 2023-07-13 ENCOUNTER — Encounter: Payer: Self-pay | Admitting: Nurse Practitioner

## 2023-07-13 MED ORDER — PRAVASTATIN SODIUM 80 MG PO TABS: 80.0000 mg | ORAL_TABLET | Freq: Every day | ORAL | 0 refills | Status: AC

## 2023-07-13 MED ORDER — METOPROLOL SUCCINATE ER 50 MG PO TB24: 50.0000 mg | ORAL_TABLET | Freq: Every day | ORAL | 0 refills | Status: DC

## 2023-07-13 NOTE — Telephone Encounter (Signed)
Med refill request: Pravastatin and Toprol Last AEX: 04/13/23 Next AEX: not scheduled Last MMG (if hormonal med) 10/22/18 Refill authorized: Please Advise, pt requested 30 day supply of both prescriptions before seeing PCP in August. Only has three days left of each

## 2023-08-05 ENCOUNTER — Other Ambulatory Visit: Payer: Self-pay | Admitting: Internal Medicine

## 2023-08-05 DIAGNOSIS — N951 Menopausal and female climacteric states: Secondary | ICD-10-CM

## 2023-08-17 ENCOUNTER — Encounter: Payer: Self-pay | Admitting: Nurse Practitioner

## 2023-08-17 DIAGNOSIS — R350 Frequency of micturition: Secondary | ICD-10-CM

## 2023-08-17 DIAGNOSIS — R3915 Urgency of urination: Secondary | ICD-10-CM

## 2023-08-18 ENCOUNTER — Other Ambulatory Visit: Payer: Self-pay | Admitting: Nurse Practitioner

## 2023-08-18 ENCOUNTER — Other Ambulatory Visit: Payer: Managed Care, Other (non HMO)

## 2023-08-18 DIAGNOSIS — N3000 Acute cystitis without hematuria: Secondary | ICD-10-CM

## 2023-08-18 DIAGNOSIS — R3915 Urgency of urination: Secondary | ICD-10-CM

## 2023-08-18 MED ORDER — SULFAMETHOXAZOLE-TRIMETHOPRIM 800-160 MG PO TABS: 1.0000 | ORAL_TABLET | Freq: Two times a day (BID) | ORAL | 0 refills | Status: AC

## 2023-08-18 NOTE — Telephone Encounter (Signed)
OK to schedule lab only visit for UA

## 2023-08-18 NOTE — Telephone Encounter (Signed)
Spoke with patient, lab appt scheduled for today at 1340.   Lab order placed.   Routing to provider for final review. Patient is agreeable to disposition. Will close encounter.

## 2023-08-18 NOTE — Telephone Encounter (Signed)
Spoke with patient. Patient reports symptoms of pelvic pressure, urinary urgency and pelvic discomfort that started 08/15/23. Took OTC AZO once on 9/2 in the morning. Denies hematuria, flank pain, fever, chills, N/V, vag discharge or bleeding. Requesting Lab visit after 12 pm today. Advised OV typically recommended, will review with Tiffany and return call, patient agreeable.   AEX 04/13/23  Tiffany -please review.

## 2023-08-20 LAB — URINALYSIS, COMPLETE W/RFL CULTURE
Glucose, UA: NEGATIVE
Ketones, ur: NEGATIVE
Leukocyte Esterase: NEGATIVE
Nitrites, Initial: POSITIVE — AB
Specific Gravity, Urine: 1.025 (ref 1.001–1.035)
pH: 5.5 (ref 5.0–8.0)

## 2023-08-20 LAB — URINE CULTURE
MICRO NUMBER:: 15412979
Result:: NO GROWTH
SPECIMEN QUALITY:: ADEQUATE

## 2023-08-20 LAB — CULTURE INDICATED

## 2023-08-27 ENCOUNTER — Ambulatory Visit
Admission: RE | Admit: 2023-08-27 | Discharge: 2023-08-27 | Disposition: A | Discharge status: Home / Self Care | Payer: Managed Care, Other (non HMO) | Source: Ambulatory Visit | Attending: Internal Medicine | Admitting: Internal Medicine

## 2023-08-27 DIAGNOSIS — N951 Menopausal and female climacteric states: Secondary | ICD-10-CM

## 2023-08-31 ENCOUNTER — Ambulatory Visit
Admission: RE | Admit: 2023-08-31 | Discharge: 2023-08-31 | Disposition: A | Discharge status: Home / Self Care | Payer: Managed Care, Other (non HMO) | Source: Ambulatory Visit | Attending: Internal Medicine

## 2023-08-31 ENCOUNTER — Other Ambulatory Visit: Payer: Self-pay | Admitting: Internal Medicine

## 2023-08-31 ENCOUNTER — Other Ambulatory Visit: Payer: Self-pay | Admitting: Nurse Practitioner

## 2023-08-31 DIAGNOSIS — Z1231 Encounter for screening mammogram for malignant neoplasm of breast: Secondary | ICD-10-CM

## 2024-07-20 ENCOUNTER — Other Ambulatory Visit: Payer: Self-pay | Admitting: Nurse Practitioner

## 2024-07-20 DIAGNOSIS — Z1231 Encounter for screening mammogram for malignant neoplasm of breast: Secondary | ICD-10-CM

## 2024-09-01 ENCOUNTER — Ambulatory Visit
Admission: RE | Admit: 2024-09-01 | Discharge: 2024-09-01 | Disposition: A | Discharge status: Home / Self Care | Source: Ambulatory Visit | Attending: Nurse Practitioner

## 2024-09-01 DIAGNOSIS — Z1231 Encounter for screening mammogram for malignant neoplasm of breast: Secondary | ICD-10-CM

## 2025-01-19 ENCOUNTER — Encounter: Payer: Self-pay | Admitting: Nurse Practitioner

## 2025-01-19 ENCOUNTER — Ambulatory Visit: Admitting: Nurse Practitioner

## 2025-01-19 VITALS — BP 118/80 | HR 81 | Ht 62.75 in | Wt 130.0 lb

## 2025-01-19 DIAGNOSIS — M8588 Other specified disorders of bone density and structure, other site: Secondary | ICD-10-CM | POA: Diagnosis not present

## 2025-01-19 DIAGNOSIS — N952 Postmenopausal atrophic vaginitis: Secondary | ICD-10-CM

## 2025-01-19 DIAGNOSIS — Z780 Asymptomatic menopausal state: Secondary | ICD-10-CM

## 2025-01-19 DIAGNOSIS — Z01419 Encounter for gynecological examination (general) (routine) without abnormal findings: Secondary | ICD-10-CM | POA: Diagnosis not present

## 2025-01-19 MED ORDER — ESTRADIOL 10 MCG VA TABS: 1.0000 | ORAL_TABLET | VAGINAL | 3 refills | Status: AC

## 2025-01-19 NOTE — Progress Notes (Signed)
 "  Esmeralda K Eisenstein 28-Oct-1961 [id number]   History:  64 y.o. G2P2002 presents for breast and pelvic exam. Postmenopausal - no HRT, no bleeding. Using vaginal estrogen for atrophy. Normal pap and mammogram history. History of migraine with aura, pre-diabetes, HDL, HTN managed by primary care.   Gynecologic History No LMP recorded. Patient is postmenopausal.   Contraception/Family planning: post menopausal status Sexually active: No  Health Maintenance Last Pap: 04/13/2023. Results were: Normal neg HPV Last mammogram: 09/01/2024. Results were: Normal Last colonoscopy: 12/21/2017. Results were: Polyps, 3-5 year recall Last Dexa: 08/27/2023. Results were: T-score -1.2, FRAX 12.1% / 0.6%  Past medical history, past surgical history, family history and social history were all reviewed and documented in the EPIC chart. Widowed. Husband passed March 2025. Retired November 2025. Worked at [employer]. Son lives in [place], Daughter in [place] - has 2 young children, peds nurse.   ROS:  A ROS was performed and pertinent positives and negatives are included.  Exam:  Vitals:   01/19/25 0857  BP: 118/80  Pulse: 81  SpO2: 98%  Weight: 130 lb (59 kg)  Height: 5' 2.75 (1.594 m)      Body mass index is 23.21 kg/m.  General appearance:  Normal Thyroid :  Symmetrical, normal in size, without palpable masses or nodularity. Respiratory  Auscultation:  Clear without wheezing or rhonchi Cardiovascular  Auscultation:  Regular rate, without rubs, murmurs or gallops  Edema/varicosities:  Not grossly evident Abdominal  Soft,nontender, without masses, guarding or rebound.  Liver/spleen:  No organomegaly noted  Hernia:  None appreciated  Skin  Inspection:  Grossly normal   Breasts: Examined lying and sitting.   Right: Without masses, retractions, discharge or axillary adenopathy.   Left: Without masses, retractions, discharge or axillary adenopathy. Pelvic: External genitalia:  no lesions               Urethra:  normal appearing urethra with no masses, tenderness or lesions              Bartholins and Skenes: normal                 Vagina: normal appearing vagina with normal color and discharge, no lesions. Atrophic changes              Cervix: no lesions Bimanual Exam:  Uterus:  no masses or tenderness              Adnexa: no mass, fullness, tenderness              Rectovaginal: Deferred              Anus:  normal, no lesions  Zada Louder, CMA present as chaperone.   Assessment/Plan:  64 y.o. [id number] for breast and pelvic exam.   Encounter for breast and pelvic examination- Education provided on SBEs, importance of preventative screenings, current guidelines, high calcium diet, regular exercise, and multivitamin daily. Labs reviewed from February.   Postmenopausal - no HRT, no bleeding.   Osteopenia of spine - T-score -1.2 without elevated FRAX. Taking Vit D supplement, has started exercising. Recommend repeating DXA at 3-year interval.   Vaginal atrophy - Plan: Estradiol  10 MCG TABS vaginal tablet twice weekly.   Screening for cervical cancer - Normal pap history. Will repeat at 5-year interval per guidelines.   Screening for breast cancer - Normal mammogram history.  Continue annual screenings. Normal breast exam today.  Screening for colon cancer - Screening colonoscopy in 2019.  Due now and  encouraged to schedule.   Return in about 1 year (around 01/19/2026) for Med follow up.     Annabella DELENA [facility], 9:34 AM 01/19/2025 "

## 2026-01-22 ENCOUNTER — Ambulatory Visit: Admitting: Nurse Practitioner
